# Patient Record
Sex: Male | Born: 1980 | Race: Black or African American | Hispanic: No | Marital: Single | Smoking: Current every day smoker
Health system: Southern US, Community
[De-identification: ages and names within clinical notes are randomized; demographics above are authoritative.]

## PROBLEM LIST (undated history)

## (undated) DIAGNOSIS — F329 Major depressive disorder, single episode, unspecified: Secondary | ICD-10-CM

## (undated) DIAGNOSIS — K5010 Crohn's disease of large intestine without complications: Secondary | ICD-10-CM

## (undated) DIAGNOSIS — F32A Depression, unspecified: Secondary | ICD-10-CM

## (undated) DIAGNOSIS — K5190 Ulcerative colitis, unspecified, without complications: Secondary | ICD-10-CM

## (undated) DIAGNOSIS — I10 Essential (primary) hypertension: Secondary | ICD-10-CM

## (undated) DIAGNOSIS — F200 Paranoid schizophrenia: Secondary | ICD-10-CM

## (undated) DIAGNOSIS — F319 Bipolar disorder, unspecified: Secondary | ICD-10-CM

## (undated) DIAGNOSIS — F22 Delusional disorders: Secondary | ICD-10-CM

## (undated) DIAGNOSIS — F419 Anxiety disorder, unspecified: Secondary | ICD-10-CM

## (undated) HISTORY — PX: OTHER SURGICAL HISTORY: SHX169

## (undated) HISTORY — PX: APPENDECTOMY: SHX54

---
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
## 2018-11-14 ENCOUNTER — Ambulatory Visit: Payer: Self-pay | Admitting: Family Medicine

## 2018-12-13 ENCOUNTER — Ambulatory Visit: Payer: Self-pay | Admitting: Internal Medicine

## 2019-08-11 ENCOUNTER — Emergency Department (HOSPITAL_COMMUNITY): Payer: Self-pay

## 2019-08-11 ENCOUNTER — Emergency Department (HOSPITAL_COMMUNITY)
Admission: EM | Admit: 2019-08-11 | Discharge: 2019-08-11 | Disposition: A | Discharge status: Home / Self Care | Payer: Self-pay | Attending: Emergency Medicine | Admitting: Emergency Medicine

## 2019-08-11 ENCOUNTER — Other Ambulatory Visit: Payer: Self-pay

## 2019-08-11 DIAGNOSIS — M545 Low back pain, unspecified: Secondary | ICD-10-CM

## 2019-08-11 DIAGNOSIS — R109 Unspecified abdominal pain: Secondary | ICD-10-CM | POA: Insufficient documentation

## 2019-08-11 LAB — CBC WITH DIFFERENTIAL/PLATELET
Abs Immature Granulocytes: 0.03 10*3/uL (ref 0.00–0.07)
Basophils Absolute: 0 10*3/uL (ref 0.0–0.1)
Basophils Relative: 0 %
Eosinophils Absolute: 0.1 10*3/uL (ref 0.0–0.5)
Eosinophils Relative: 1 %
HCT: 53.9 % — ABNORMAL HIGH (ref 39.0–52.0)
Hemoglobin: 17.4 g/dL — ABNORMAL HIGH (ref 13.0–17.0)
Immature Granulocytes: 0 %
Lymphocytes Relative: 18 %
Lymphs Abs: 2.1 10*3/uL (ref 0.7–4.0)
MCH: 31.9 pg (ref 26.0–34.0)
MCHC: 32.3 g/dL (ref 30.0–36.0)
MCV: 98.7 fL (ref 80.0–100.0)
Monocytes Absolute: 0.9 10*3/uL (ref 0.1–1.0)
Monocytes Relative: 7 %
Neutro Abs: 8.8 10*3/uL — ABNORMAL HIGH (ref 1.7–7.7)
Neutrophils Relative %: 74 %
Platelets: 230 10*3/uL (ref 150–400)
RBC: 5.46 MIL/uL (ref 4.22–5.81)
RDW: 10.9 % — ABNORMAL LOW (ref 11.5–15.5)
WBC: 11.9 10*3/uL — ABNORMAL HIGH (ref 4.0–10.5)
nRBC: 0 % (ref 0.0–0.2)

## 2019-08-11 LAB — BASIC METABOLIC PANEL
Anion gap: 8 (ref 5–15)
BUN: 7 mg/dL (ref 6–20)
CO2: 25 mmol/L (ref 22–32)
Calcium: 9.2 mg/dL (ref 8.9–10.3)
Chloride: 106 mmol/L (ref 98–111)
Creatinine, Ser: 1.24 mg/dL (ref 0.61–1.24)
GFR calc Af Amer: >60 mL/min (ref >60)
GFR calc non Af Amer: >60 mL/min (ref >60)
Glucose, Bld: 88 mg/dL (ref 70–99)
Potassium: 4.4 mmol/L (ref 3.5–5.1)
Sodium: 139 mmol/L (ref 135–145)

## 2019-08-11 LAB — URINALYSIS, ROUTINE W REFLEX MICROSCOPIC
Bilirubin Urine: NEGATIVE
Glucose, UA: NEGATIVE mg/dL
Ketones, ur: NEGATIVE mg/dL
Leukocytes,Ua: NEGATIVE
Nitrite: NEGATIVE
Protein, ur: NEGATIVE mg/dL
Specific Gravity, Urine: 1.019 (ref 1.005–1.030)
pH: 6 (ref 5.0–8.0)

## 2019-08-11 MED ORDER — KETOROLAC TROMETHAMINE 60 MG/2ML IM SOLN
60.0000 mg | Freq: Once | INTRAMUSCULAR | Status: AC
  Administered 2019-08-11: 60 mg via INTRAMUSCULAR
  Filled 2019-08-11: qty 2

## 2019-08-11 MED ORDER — ACETAMINOPHEN 500 MG PO TABS
1000.0000 mg | ORAL_TABLET | Freq: Once | ORAL | Status: AC
  Administered 2019-08-11: 18:00:00 1000 mg via ORAL
  Filled 2019-08-11: qty 2

## 2019-08-11 MED ORDER — DICLOFENAC SODIUM 1 % TD GEL: 4.0000 g | Freq: Four times a day (QID) | TRANSDERMAL | 0 refills | Status: DC

## 2019-08-11 MED ORDER — PREDNISONE 10 MG (21) PO TBPK: ORAL_TABLET | Freq: Every day | ORAL | 0 refills | Status: DC

## 2019-08-11 MED ORDER — METHOCARBAMOL 500 MG PO TABS
500.0000 mg | ORAL_TABLET | Freq: Once | ORAL | Status: AC
  Administered 2019-08-11: 500 mg via ORAL
  Filled 2019-08-11: qty 1

## 2019-08-11 MED ORDER — METHOCARBAMOL 500 MG PO TABS: 500.0000 mg | ORAL_TABLET | Freq: Three times a day (TID) | ORAL | 0 refills | Status: AC

## 2019-08-11 NOTE — ED Notes (Signed)
Patient left without discharge and prescriptions.

## 2019-08-11 NOTE — Discharge Instructions (Addendum)
You were seen in the ER for low left back pain  Initial x-ray showed possible right kidney stone however CT of kidney clarified and you do not have a stone.   Urine did not look infected.   Imaging showed mild bone slipping in low back bones.  This is called anterolisthesis of S1 on S2. This could be irritating a nerve.  This could also just be muscle spasms and tightness.   We will treat your inflammation with the following medication regimen: Prednisone taper which helps nerve inflammation and pain Robaxin 500 mg every 8 hours x 5 days Ibuprofen 600 mg + Tylenol 1000 mg every 8 hours for the next 5 days Heating pad as needed Over the counter lidocaine patches (salonpas) can be helpful Voltaren gel to use over painful area, massage into muscles. Apply heating pack to the area after gel to help absorb   Avoid any exacerbating activities for the next 48 hours.  After 48 hours, start doing light back range of motion exercises and walking to avoid worsening back stiffness.   Return for fevers, chills, abdominal pain, changes in bowel movement, urinary symptoms, groin numbness, loss of bladder or bowel control, numbness weakness or heaviness to your extremities, rash.

## 2019-08-11 NOTE — ED Notes (Signed)
No answer x1

## 2019-08-11 NOTE — ED Notes (Signed)
Patient in CT

## 2019-08-11 NOTE — ED Notes (Signed)
Pt came out threatening to leave. Writer explained to pt he was waiting for his results to come in and the MD would explain the results to him. Pt waiting in room at this time.

## 2019-08-11 NOTE — ED Triage Notes (Signed)
Pt reports left lower back pains since he was seen at [facility] center several weeks ago. Reports had kidney problem. Pt unable to sit in triage due to pains.

## 2019-08-11 NOTE — ED Provider Notes (Signed)
[facility] DEPT Provider Note   CSN: [id number] Arrival date & time: 08/11/19  1516     History   Chief Complaint Chief Complaint  Patient presents with  . Back Pain    HPI Travis Goodman is a 38 y.o. male with history of Crohn's presents to the ER for evaluation of left lower back pain that began 2 days ago.  Described as constant, sharp.  Nonradiating.  It is worse with laughing, coughing, moving, bending motions of the trunk, putting weight on his left leg.  States yesterday he drove approximately 6 hours and states usually he leans mostly on his left buttock when he is driving.  No interventions.  No alleviating factors.  Yesterday he had mild nausea but no vomiting.  States he has had "urethra" problems for his entire life.  Describes this as having to push to urinate.  He got out of prison 9 months ago.  States he used to be on a pill for his urethra while in prison but once he got out he was not able to get a doctor so he has not been on any medicines.  He is also supposed to be on 2 medicines for Crohn's but has not been taking them.  No falls, previous back injuries or surgeries.  He does a lot of lifting at work.  No fever, chills, vomiting, anterior abdominal pain, dysuria, hematuria.  States he still has to strain to urinate but this is not new.  No history of kidney stones.  No saddle anesthesia, loss of bladder or bowel function, distal numbness or weakness.  Was constipated but started having normal BMs two days ago. No diarrhea or melena.      HPI  No past medical history on file.  There are no active problems to display for this patient.   ** The histories are not reviewed yet. Please review them in the "History" navigator section and refresh this [place].      Home Medications    Prior to Admission medications   Medication Sig Start Date End Date Taking? Authorizing Provider  diclofenac sodium (VOLTAREN) 1 % GEL Apply 4 g topically  4 (four) times daily. 08/11/19   Kinnie Feil, PA-C  methocarbamol (ROBAXIN) 500 MG tablet Take 1 tablet (500 mg total) by mouth 3 (three) times daily for 7 days. 08/11/19 08/18/19  Kinnie Feil, PA-C  predniSONE (STERAPRED UNI-PAK 21 TAB) 10 MG (21) TBPK tablet Take by mouth daily. Take 6 tabs by mouth daily  for 2 days, then 5 tabs for 2 days, then 4 tabs for 2 days, then 3 tabs for 2 days, 2 tabs for 2 days, then 1 tab by mouth daily for 2 days 08/11/19   Kinnie Feil, PA-C    Family History No family history on file.  Social History Social History   Tobacco Use  . Smoking status: Not on file  Substance Use Topics  . Alcohol use: Not on file  . Drug use: Not on file     Allergies   Patient has no known allergies.   Review of Systems Review of Systems  Genitourinary: Positive for difficulty urinating (chronic) and flank pain.  Musculoskeletal: Positive for back pain and myalgias.  All other systems reviewed and are negative.    Physical Exam Updated Vital Signs BP (!) 148/87 (BP Location: Right Arm)   Pulse 70   Temp 98.3 F (36.8 C) (Oral)   Resp 20   SpO2  100%   Physical Exam Vitals signs and nursing note reviewed.  Constitutional:      Appearance: He is well-developed.     Comments: Non toxic.  Looks slightly uncomfortable, sitting with weight on right buttock/side   HENT:     Head: Normocephalic and atraumatic.     Nose: Nose normal.  Eyes:     Conjunctiva/sclera: Conjunctivae normal.  Neck:     Musculoskeletal: Normal range of motion.  Cardiovascular:     Rate and Rhythm: Normal rate and regular rhythm.     Heart sounds: Normal heart sounds.     Comments: 1+ radial and DP pulses bilaterally  Pulmonary:     Effort: Pulmonary effort is normal.     Breath sounds: Normal breath sounds.  Abdominal:     General: Bowel sounds are normal.     Palpations: Abdomen is soft.     Tenderness: There is no abdominal tenderness. There is left CVA  tenderness.     Comments: Left low lateral CVA tenderness. No reproducible muscular tenderness with palpation on thoracic/lumbar muscles.  No G/R/R. No suprapubic tenderness. Negative Murphy's and McBurney's  Musculoskeletal: Normal range of motion.        General: Tenderness present.     Comments: TL spine: no midline or paraspinal muscle tenderness. No reproducible tenderness with palpation to muscles of back.  Mild left SI joint and sciatic notch tenderness. + SLR (pain in left low back and buttock).  Ambulates to bathroom with antalgic gait favoring left.  Pelvis: no bony tenderness to inguinal creases, greater trochanters bilaterally. IR/ER of hips bilaterally without pain.  Skin:    General: Skin is warm and dry.     Capillary Refill: Capillary refill takes less than 2 seconds.  Neurological:     Mental Status: He is alert.     Comments: Sensation and strength intact in LEs  Psychiatric:        Behavior: Behavior normal.      ED Treatments / Results  Labs (all labs ordered are listed, but only abnormal results are displayed) Labs Reviewed  URINALYSIS, ROUTINE W REFLEX MICROSCOPIC - Abnormal; Notable for the following components:      Result Value   APPearance HAZY (*)    Hgb urine dipstick SMALL (*)    Bacteria, UA RARE (*)    All other components within normal limits  CBC WITH DIFFERENTIAL/PLATELET - Abnormal; Notable for the following components:   WBC 11.9 (*)    Hemoglobin 17.4 (*)    HCT 53.9 (*)    RDW 10.9 (*)    Neutro Abs 8.8 (*)    All other components within normal limits  BASIC METABOLIC PANEL    EKG None  Radiology Dg Lumbar Spine 2-3 Views  Result Date: 08/11/2019 CLINICAL DATA:  Acute low back pain for several weeks. Initial encounter. EXAM: LUMBAR SPINE - 2-3 VIEW COMPARISON:  None. FINDINGS: Five non rib-bearing lumbar type vertebra are identified with a lumbarized S1 segment. Apparent mild grade 1 anterolisthesis of S1 on S2 noted. Degenerative disc  disease at T12-L1 noted. No acute fracture noted.  No focal bony lesions are present. IMPRESSION: Lumbarized S1 segment with apparent grade 1 anterolisthesis of S1 on S2. No acute fracture. Electronically Signed   By: Margarette Canada M.D.   On: 08/11/2019 18:30   Dg Si Joints  Result Date: 08/11/2019 CLINICAL DATA:  Bilateral SI joint pain.  Initial encounter. EXAM: BILATERAL SACROILIAC JOINTS - 3+ VIEW COMPARISON:  None.  FINDINGS: The sacroiliac joint spaces are maintained and there is no evidence of arthropathy. No other bone abnormalities are seen. IMPRESSION: Negative. Electronically Signed   By: Margarette Canada M.D.   On: 08/11/2019 18:30   Dg Abdomen Acute W/chest  Result Date: 08/11/2019 CLINICAL DATA:  Acute abdominal pain for several weeks. EXAM: DG ABDOMEN ACUTE W/ 1V CHEST COMPARISON:  None. FINDINGS: A 2 mm calcification within the RIGHT anatomic pelvis is nonspecific-correlate with possibility of distal ureteral calculus. The bowel gas pattern is unremarkable. Cardiomediastinal silhouette is unremarkable. The lungs are clear. No acute bony abnormalities are noted. IMPRESSION: 2 mm calcification within the RIGHT anatomic pelvis-nonspecific. Consider CT if there is clinical suspicion for distal RIGHT ureteral calculus. Electronically Signed   By: Margarette Canada M.D.   On: 08/11/2019 18:34   Ct Renal Stone Study  Result Date: 08/11/2019 CLINICAL DATA:  38 year old male with acute LEFT flank and abdominal pain. EXAM: CT ABDOMEN AND PELVIS WITHOUT CONTRAST TECHNIQUE: Multidetector CT imaging of the abdomen and pelvis was performed following the standard protocol without IV contrast. COMPARISON:  Radiographs performed today FINDINGS: Please note that parenchymal abnormalities may be missed without intravenous contrast. Lower chest: No acute abnormality. Hepatobiliary: The liver and gallbladder are unremarkable. No biliary dilatation. Pancreas: Unremarkable Spleen: Unremarkable Adrenals/Urinary Tract: The  kidneys, adrenal glands and bladder are unremarkable. No biliary dilatation. Stomach/Bowel: Stomach is within normal limits. No evidence of bowel wall thickening, distention, or inflammatory changes. Vascular/Lymphatic: Aortic atherosclerosis. No enlarged abdominal or pelvic lymph nodes. Reproductive: Prostate is unremarkable. Other: No ascites, pneumoperitoneum or focal collection. Musculoskeletal: No acute or suspicious bony abnormality. IMPRESSION: 1. No evidence of acute abnormality. No hydronephrosis or urinary calculi. 2.  Aortic Atherosclerosis (ICD10-I70.0). Electronically Signed   By: Margarette Canada M.D.   On: 08/11/2019 19:18    Procedures Procedures (including critical care time)  Medications Ordered in ED Medications  ketorolac (TORADOL) injection 60 mg (60 mg Intramuscular Given 08/11/19 1744)  acetaminophen (TYLENOL) tablet 1,000 mg (1,000 mg Oral Given 08/11/19 1744)  methocarbamol (ROBAXIN) tablet 500 mg (500 mg Oral Given 08/11/19 1744)     Initial Impression / Assessment and Plan / ED Course  I have reviewed the triage vital signs and the nursing notes.  Pertinent labs & imaging results that were available during my care of the patient were reviewed by me and considered in my medical decision making (see chart for details).  Clinical Course as of Aug 10 2156  Mon Aug 11, 2019  1825 RBC / HPF: 0-5 [CG]  1825 Bacteria, UA(!): RARE [CG]  1825 Ca Oxalate Crys, UA: PRESENT [CG]  1838 IMPRESSION: Lumbarized S1 segment with apparent grade 1 anterolisthesis of S1 on S2.  No acute fracture.  DG Lumbar Spine 2-3 Views [CG]  1838 2 mm calcification within the RIGHT anatomic pelvis-nonspecific. Consider CT if there is clinical suspicion for distal RIGHT ureteral calculus.  DG Abdomen Acute W/Chest [CG]  1932 IMPRESSION: 1. No evidence of acute abnormality. No hydronephrosis or urinary calculi. 2. Aortic Atherosclerosis (ICD10-I70.0).  CT Renal Stone Study [CG]  1942 WBC(!): 11.9  [CG]    Clinical Course User Index [CG] Kinnie Feil, PA-C   38 year old here with atraumatic left low back pain.  Drove 6 hours in his car yesterday and states he usually sits mostly on his left buttock while driving.  Reports chronic "urethra" problems specifically having to strain to urinate but these have not changed recently.  No urinary symptoms but had nausea  2 days ago.  No fever, cauda equina symptoms, changes in his bowel movements.  No history of kidney stones.  Exam reveals left CVA tenderness and reproducible left SI and sciatic notch tenderness with positive SLR.  Based on exam and history DDX includes both MSK etiology but also renal etiology such as pyelonephritis, kidney stone although the latter is considered less likely because he has no fever, vomiting, dysuria, hematuria or history of stones in the past.  He also suggest preceding event that could have irritated the left sciatic nerve/muscles in the left low back/buttock.  We will start with urinalysis and x-rays of the lumbar spine, SI joints and abdomen to evaluate for acute bony abnormalities as well as obvious renal stones on KUB.  Toradol, Tylenol and Robaxin given.  We will plan to reassess.  Pending UA and x-rays  1830: UA shows rare bacteria, mucus and calcium oxalate crystals.  No convincing signs of infection.  No RBCs. Given ca oxalate and flank pain with nausea concern for stone. KUB some RIGHT ureteral stone but none on the symptomatic side. Lumbar x-rays shows anterolisthesis of S1 on S2 which could be causing left sided pain/radiculopathy.  Reassessed patient and reports only mild improvement in pain.  Discussed plan to f/u with cbc, bmp and CT renal to r/o small renal stone, agreeable.   2255: CT renal unremarkable. Creatinine normal.  Will dc with symptomatic tx of MSK low back pain vs radicular/sciatic nerve symptoms vs piriformis muscle syndrome.    Unfortunately pt eloped without AVS or prescriptions.    Final Clinical Impressions(s) / ED Diagnoses   Final diagnoses:  Lower back pain    ED Discharge Orders         Ordered    predniSONE (STERAPRED UNI-PAK 21 TAB) 10 MG (21) TBPK tablet  Daily     08/11/19 1954    methocarbamol (ROBAXIN) 500 MG tablet  3 times daily     08/11/19 1954    diclofenac sodium (VOLTAREN) 1 % GEL  4 times daily     08/11/19 1954           Arlean Hopping 08/11/19 2157    Lacretia Leigh, MD 08/12/19 [phone]

## 2019-08-11 NOTE — ED Notes (Signed)
Refuse vitals

## 2019-12-22 ENCOUNTER — Other Ambulatory Visit: Payer: Self-pay

## 2019-12-22 ENCOUNTER — Encounter (HOSPITAL_COMMUNITY): Payer: Self-pay

## 2019-12-22 ENCOUNTER — Emergency Department (HOSPITAL_COMMUNITY)
Admission: EM | Admit: 2019-12-22 | Discharge: 2019-12-22 | Disposition: A | Discharge status: Home / Self Care | Payer: Medicaid Other | Attending: Emergency Medicine | Admitting: Emergency Medicine

## 2019-12-22 DIAGNOSIS — Y999 Unspecified external cause status: Secondary | ICD-10-CM | POA: Insufficient documentation

## 2019-12-22 DIAGNOSIS — Y939 Activity, unspecified: Secondary | ICD-10-CM | POA: Insufficient documentation

## 2019-12-22 DIAGNOSIS — M5441 Lumbago with sciatica, right side: Secondary | ICD-10-CM | POA: Insufficient documentation

## 2019-12-22 DIAGNOSIS — Z79899 Other long term (current) drug therapy: Secondary | ICD-10-CM | POA: Insufficient documentation

## 2019-12-22 DIAGNOSIS — S71101A Unspecified open wound, right thigh, initial encounter: Secondary | ICD-10-CM | POA: Insufficient documentation

## 2019-12-22 DIAGNOSIS — G8929 Other chronic pain: Secondary | ICD-10-CM

## 2019-12-22 DIAGNOSIS — F17210 Nicotine dependence, cigarettes, uncomplicated: Secondary | ICD-10-CM | POA: Insufficient documentation

## 2019-12-22 DIAGNOSIS — Y929 Unspecified place or not applicable: Secondary | ICD-10-CM | POA: Insufficient documentation

## 2019-12-22 DIAGNOSIS — X58XXXA Exposure to other specified factors, initial encounter: Secondary | ICD-10-CM | POA: Insufficient documentation

## 2019-12-22 DIAGNOSIS — H538 Other visual disturbances: Secondary | ICD-10-CM | POA: Insufficient documentation

## 2019-12-22 DIAGNOSIS — H547 Unspecified visual loss: Secondary | ICD-10-CM

## 2019-12-22 DIAGNOSIS — F319 Bipolar disorder, unspecified: Secondary | ICD-10-CM | POA: Insufficient documentation

## 2019-12-22 HISTORY — DX: Depression, unspecified: F32.A

## 2019-12-22 HISTORY — DX: Ulcerative colitis, unspecified, without complications: K51.90

## 2019-12-22 HISTORY — DX: Anxiety disorder, unspecified: F41.9

## 2019-12-22 HISTORY — DX: Delusional disorders: F22

## 2019-12-22 HISTORY — DX: Bipolar disorder, unspecified: F31.9

## 2019-12-22 HISTORY — DX: Crohn's disease of large intestine without complications: K50.10

## 2019-12-22 HISTORY — DX: Paranoid schizophrenia: F20.0

## 2019-12-22 MED ORDER — LIDOCAINE 5 % EX PTCH: 1.0000 | MEDICATED_PATCH | CUTANEOUS | 0 refills | Status: DC

## 2019-12-22 MED ORDER — METHOCARBAMOL 500 MG PO TABS: 500.0000 mg | ORAL_TABLET | Freq: Two times a day (BID) | ORAL | 0 refills | Status: DC

## 2019-12-22 NOTE — Discharge Instructions (Addendum)
  Recommend follow-up with the ophthalmologist (eye doctor) to evaluate your vision changes.  Call to make an appointment.  Take it easy, but do not lay around too much as this may make any stiffness worse.  Antiinflammatory medications: Take 600 mg of ibuprofen every 6 hours or 440 mg (over the counter dose) to 500 mg (prescription dose) of naproxen every 12 hours for the next 3 days. After this time, these medications may be used as needed for pain. Take these medications with food to avoid upset stomach. Choose only one of these medications, do not take them together. Acetaminophen (generic for Tylenol): Should you continue to have additional pain while taking the ibuprofen or naproxen, you may add in acetaminophen as needed. Your daily total maximum amount of acetaminophen from all sources should be limited to 4065m/day for persons without liver problems, or 20088mday for those with liver problems. Methocarbamol: Methocarbamol (generic for Robaxin) is a muscle relaxer and can help relieve stiff muscles or muscle spasms.  Do not drive or perform other dangerous activities while taking this medication as it can cause drowsiness as well as changes in reaction time and judgement. Lidocaine patches: These are available via either prescription or over-the-counter. The over-the-counter option may be more economical one and are likely just as effective. There are multiple over-the-counter brands, such as Salonpas. Ice: May apply ice to the area over the next 24 hours for 15 minutes at a time to reduce pain, inflammation, and swelling, if present. Exercises: Be sure to perform the attached exercises starting with three times a week and working up to performing them daily. This is an essential part of preventing long term problems.  Follow up: Follow up with a primary care provider for any future management of these complaints. Be sure to follow up within 7-10 days. Return: Return to the ED should symptoms  worsen.  For prescription assistance, may try using prescription discount sites or apps, such as goodrx.com

## 2019-12-22 NOTE — ED Provider Notes (Signed)
[facility] DEPT Provider Note   CSN: [id number] Arrival date & time: 12/22/19  1752     History Chief Complaint  Patient presents with  . leg wound  . Back Pain  . eye issue    Travis Goodman is a 39 y.o. male.  HPI      Travis Goodman is a 39 y.o. male, with a history of HTN, bipolar, anxiety, depression, paranoid schizophrenia, presenting to the ED with several complaints.  Patient complains of intermittent right-sided back spasms for the last 5 months.  He states he has times of them and they last no more than a few seconds at a time.  Moderate to severe pain, radiating down the back of the right leg.  Accompanied by tingling in the right leg.  Denies saddle anesthesias, fever, trauma, persistent weakness, persistent numbness, inability to urinate or incontinence.  Denies IV drug use, or HIV.  Wound on the right medial upper leg he states has been present for the last month.  He states it looks much better than it did previously.  Denies any pain with the wound, drainage, swelling.  For the last several months he will have intermittent episodes with blurriness in one eye or the other, sometimes both eyes.  These episodes last for a few seconds at a time with no persistent symptoms.  No persistent headaches, vision loss, eye trauma, eye drainage, eye pain, or any other complaints.  He states he recently got out of prison and does not have a primary care provider to whom he can bring these issues. He is followed by [facility] for his mental health and states he has adequate supply of his medications.  He has not been experiencing any acute psychiatric difficulties.    Past Medical History:  Diagnosis Date  . Anxiety   . Bipolar 1 disorder ([place])   . Crohn's colitis ([place])   . Depression   . Paranoia ([place])   . Schizophrenia, paranoid type ([place])   . Ulcerative colitis ([place])     There are no problems to display for this patient.   Past Surgical  History:  Procedure Laterality Date  . APPENDECTOMY    . arm fracture surgery         Family History  Problem Relation Age of Onset  . Heart failure Mother   . Diabetes Mother   . Hypertension Mother     Social History   Tobacco Use  . Smoking status: Current Every Day Smoker    Packs/day: 1.00    Types: Cigarettes  . Smokeless tobacco: Never Used  Substance Use Topics  . Alcohol use: Never  . Drug use: Yes    Types: Marijuana    Home Medications Prior to Admission medications   Medication Sig Start Date End Date Taking? Authorizing Provider  diclofenac sodium (VOLTAREN) 1 % GEL Apply 4 g topically 4 (four) times daily. 08/11/19   Kinnie Feil, PA-C  lidocaine (LIDODERM) 5 % Place 1 patch onto the skin daily. Remove & Discard patch within 12 hours or as directed by MD 12/22/19   Lashica Hannay C, PA-C  methocarbamol (ROBAXIN) 500 MG tablet Take 1 tablet (500 mg total) by mouth 2 (two) times daily. 12/22/19   Patty Leitzke C, PA-C  predniSONE (STERAPRED UNI-PAK 21 TAB) 10 MG (21) TBPK tablet Take by mouth daily. Take 6 tabs by mouth daily  for 2 days, then 5 tabs for 2 days, then 4 tabs for 2 days, then  3 tabs for 2 days, 2 tabs for 2 days, then 1 tab by mouth daily for 2 days 08/11/19   Kinnie Feil, PA-C    Allergies    Patient has no known allergies.  Review of Systems   Review of Systems  Constitutional: Negative for chills, diaphoresis and fever.  HENT: Negative for facial swelling.   Eyes: Positive for visual disturbance.  Respiratory: Negative for cough and shortness of breath.   Cardiovascular: Negative for chest pain and leg swelling.  Gastrointestinal: Negative for abdominal pain, diarrhea, nausea and vomiting.  Musculoskeletal: Positive for back pain.  Skin: Positive for wound.  Neurological: Negative for dizziness, syncope, weakness and numbness.  All other systems reviewed and are negative.   Physical Exam Updated Vital Signs BP (!) 162/104 (BP  Location: Left Arm) Comment: Patient states " I have high BP but do not take prescribed meds."  Pulse 89   Temp 98.6 F (37 C) (Oral)   Resp 20   Ht 5' 9"  (1.753 m)   Wt 74.8 kg   SpO2 98%   BMI 24.37 kg/m   Physical Exam Vitals and nursing note reviewed.  Constitutional:      General: He is not in acute distress.    Appearance: He is well-developed. He is not diaphoretic.  HENT:     Head: Normocephalic and atraumatic.     Mouth/Throat:     Mouth: Mucous membranes are moist.     Pharynx: Oropharynx is clear.  Eyes:     Extraocular Movements: Extraocular movements intact.     Conjunctiva/sclera: Conjunctivae normal.     Pupils: Pupils are equal, round, and reactive to light.     Comments: Patient does not seem to have gross abnormalities in his close-up or distance vision.  He is able to see things around the room.  He is able to identify features on my scribes, read, identify objects. No pain with EOMs.  No facial swelling, erythema, or wounds. No eye ptosis.  EOMs appear to in sync bilaterally.  Cardiovascular:     Rate and Rhythm: Normal rate and regular rhythm.     Pulses: Normal pulses.          Radial pulses are 2+ on the right side and 2+ on the left side.       Posterior tibial pulses are 2+ on the right side and 2+ on the left side.     Heart sounds: Normal heart sounds.     Comments: Tactile temperature in the extremities appropriate and equal bilaterally. Pulmonary:     Effort: Pulmonary effort is normal. No respiratory distress.     Breath sounds: Normal breath sounds.  Abdominal:     Palpations: Abdomen is soft.     Tenderness: There is no abdominal tenderness. There is no guarding.  Musculoskeletal:     Cervical back: Neck supple.     Right lower leg: No edema.     Left lower leg: No edema.     Comments: No tenderness throughout the back musculature. Normal motor function intact in all extremities. No midline spinal tenderness.   Scabbed over approximately  1.5 cm diameter wound to the right medial upper leg, just superior to the knee.  No tenderness, swelling, fluctuance, increased warmth, surrounding erythema. No pain or tenderness throughout the upper or lower leg musculature. No pain, swelling, deformity, or instability to the knee.  No pain with range of motion of the right knee.  Lymphadenopathy:  Cervical: No cervical adenopathy.  Skin:    General: Skin is warm and dry.  Neurological:     Mental Status: He is alert and oriented to person, place, and time.     Comments: No noted acute cognitive deficit. Sensation grossly intact to light touch in the extremities.   Grip strengths equal bilaterally.   Strength 5/5 in all extremities.  No gait disturbance.  Coordination intact.  Cranial nerves III-XII grossly intact.  Handles oral secretions without noted difficulty.  No noted phonation or speech deficit. No facial droop.   Psychiatric:        Mood and Affect: Mood and affect normal.        Speech: Speech normal.        Behavior: Behavior normal.                ED Results / Procedures / Treatments   Labs (all labs ordered are listed, but only abnormal results are displayed) Labs Reviewed - No data to display  EKG None  Radiology No results found.  Procedures Procedures (including critical care time)  Medications Ordered in ED Medications - No data to display  ED Course  I have reviewed the triage vital signs and the nursing notes.  Pertinent labs & imaging results that were available during my care of the patient were reviewed by me and considered in my medical decision making (see chart for details).    MDM Rules/Calculators/A&P                      Patient presents with a multitude of complaints. I do not see any emergent medical conditions at patient presentation today. His vision abnormality is intermittent and fleeting.  He will follow-up with ophthalmology on this matter. Advised him to  follow-up with a PCP versus orthopedics on his back pain. His scabbed over wound on his leg appears to be healing well.  He showed me photos of what it initially looked like and there has been significant improvement, which is quite reassuring. I gave him resources for PCP follow-up. The patient was given instructions for home care as well as return precautions. Patient voices understanding of these instructions, accepts the plan, and is comfortable with discharge.  Vitals:   12/22/19 1805 12/22/19 1816 12/22/19 2114  BP: (!) 162/104  (!) 144/71  Pulse: 89  88  Resp: 20  18  Temp: 98.6 F (37 C)    TempSrc: Oral    SpO2: 98%  99%  Weight:  74.8 kg   Height:  5' 9"  (1.753 m)    Patient noted to be hypertensive today.  He does not appear to be symptomatic to this.  He states he has a history of hypertension, but no recent history of medication management for it.  Advised PCP follow-up for further management of this issue.  Final Clinical Impression(s) / ED Diagnoses Final diagnoses:  Chronic right-sided low back pain with right-sided sciatica  Alteration in vision    Rx / DC Orders ED Discharge Orders         Ordered    methocarbamol (ROBAXIN) 500 MG tablet  2 times daily     12/22/19 2047    lidocaine (LIDODERM) 5 %  Every 24 hours     12/22/19 2047           Lorayne Bender, PA-C 12/23/19 [place], Dan, DO 12/23/19 1936

## 2019-12-22 NOTE — ED Triage Notes (Signed)
Patient reports that he is having right lower back pain x 5 months  Patient states he is having vision problems out of his right eye. Patient states, "I have a circle in my vision and I can not see out of the circle."  Patient states he has a leg wound on the inner right knee x 1 month or longer.

## 2019-12-22 NOTE — ED Notes (Signed)
An After Visit Summary was printed and given to the patient. Discharge instructions given and no further questions at this time.  

## 2020-02-08 ENCOUNTER — Emergency Department
Admission: EM | Admit: 2020-02-08 | Discharge: 2020-02-08 | Disposition: A | Discharge status: Home / Self Care | Payer: Medicaid Other | Attending: Emergency Medicine | Admitting: Emergency Medicine

## 2020-02-08 ENCOUNTER — Other Ambulatory Visit: Payer: Self-pay

## 2020-02-08 ENCOUNTER — Encounter: Payer: Self-pay | Admitting: Emergency Medicine

## 2020-02-08 DIAGNOSIS — Z79899 Other long term (current) drug therapy: Secondary | ICD-10-CM | POA: Insufficient documentation

## 2020-02-08 DIAGNOSIS — L989 Disorder of the skin and subcutaneous tissue, unspecified: Secondary | ICD-10-CM | POA: Insufficient documentation

## 2020-02-08 DIAGNOSIS — F17210 Nicotine dependence, cigarettes, uncomplicated: Secondary | ICD-10-CM | POA: Insufficient documentation

## 2020-02-08 DIAGNOSIS — Z113 Encounter for screening for infections with a predominantly sexual mode of transmission: Secondary | ICD-10-CM | POA: Insufficient documentation

## 2020-02-08 DIAGNOSIS — Z711 Person with feared health complaint in whom no diagnosis is made: Secondary | ICD-10-CM

## 2020-02-08 LAB — CHLAMYDIA/NGC RT PCR (ARMC ONLY)
Chlamydia Tr: NOT DETECTED
N gonorrhoeae: NOT DETECTED

## 2020-02-08 LAB — COMPREHENSIVE METABOLIC PANEL
ALT: 20 U/L (ref 0–44)
AST: 21 U/L (ref 15–41)
Albumin: 4.4 g/dL (ref 3.5–5.0)
Alkaline Phosphatase: 107 U/L (ref 38–126)
Anion gap: 7 (ref 5–15)
BUN: 10 mg/dL (ref 6–20)
CO2: 32 mmol/L (ref 22–32)
Calcium: 9.5 mg/dL (ref 8.9–10.3)
Chloride: 101 mmol/L (ref 98–111)
Creatinine, Ser: 1.22 mg/dL (ref 0.61–1.24)
GFR calc Af Amer: >60 mL/min (ref >60)
GFR calc non Af Amer: >60 mL/min (ref >60)
Glucose, Bld: 87 mg/dL (ref 70–99)
Potassium: 4.1 mmol/L (ref 3.5–5.1)
Sodium: 140 mmol/L (ref 135–145)
Total Bilirubin: 0.8 mg/dL (ref 0.3–1.2)
Total Protein: 7.5 g/dL (ref 6.5–8.1)

## 2020-02-08 LAB — CBC WITH DIFFERENTIAL/PLATELET
Abs Immature Granulocytes: 0.02 10*3/uL (ref 0.00–0.07)
Basophils Absolute: 0 10*3/uL (ref 0.0–0.1)
Basophils Relative: 1 %
Eosinophils Absolute: 0.1 10*3/uL (ref 0.0–0.5)
Eosinophils Relative: 1 %
HCT: 50.5 % (ref 39.0–52.0)
Hemoglobin: 16.9 g/dL (ref 13.0–17.0)
Immature Granulocytes: 0 %
Lymphocytes Relative: 24 %
Lymphs Abs: 1.9 10*3/uL (ref 0.7–4.0)
MCH: 32.3 pg (ref 26.0–34.0)
MCHC: 33.5 g/dL (ref 30.0–36.0)
MCV: 96.4 fL (ref 80.0–100.0)
Monocytes Absolute: 0.5 10*3/uL (ref 0.1–1.0)
Monocytes Relative: 7 %
Neutro Abs: 5.3 10*3/uL (ref 1.7–7.7)
Neutrophils Relative %: 67 %
Platelets: 227 10*3/uL (ref 150–400)
RBC: 5.24 MIL/uL (ref 4.22–5.81)
RDW: 10.6 % — ABNORMAL LOW (ref 11.5–15.5)
WBC: 7.8 10*3/uL (ref 4.0–10.5)
nRBC: 0 % (ref 0.0–0.2)

## 2020-02-08 LAB — URINALYSIS, COMPLETE (UACMP) WITH MICROSCOPIC
Bacteria, UA: NONE SEEN
Bilirubin Urine: NEGATIVE
Glucose, UA: NEGATIVE mg/dL
Ketones, ur: NEGATIVE mg/dL
Leukocytes,Ua: NEGATIVE
Nitrite: NEGATIVE
Protein, ur: NEGATIVE mg/dL
Specific Gravity, Urine: 1.017 (ref 1.005–1.030)
pH: 6 (ref 5.0–8.0)

## 2020-02-08 NOTE — ED Provider Notes (Signed)
[facility] Emergency Department Provider Note   ____________________________________________   First MD Initiated Contact with Patient 02/08/20 1308     (approximate)  I have reviewed the triage vital signs and the nursing notes.   HISTORY  Chief Complaint No chief complaint on file.   HPI Travis Goodman is a 39 y.o. male presents to the ED with complaint of right knee pain.  He states there is an area on his knee that has been there for 3 months.  He states that it has become tender to touch.  He states initially there was "a scab" there and after falling off is become tender.  He is unable to explain why there was a scab present in the beginning and denies any known injury.  He also has a similar area to his left anterior shoulder.  Patient is also here to be checked for an STD.  He states that "someone" told him that he should be checked for an STD.       Past Medical History:  Diagnosis Date  . Anxiety   . Bipolar 1 disorder ([place])   . Crohn's colitis ([place])   . Depression   . Paranoia ([place])   . Schizophrenia, paranoid type ([place])   . Ulcerative colitis ([place])     There are no problems to display for this patient.   Past Surgical History:  Procedure Laterality Date  . APPENDECTOMY    . arm fracture surgery      Prior to Admission medications   Medication Sig Start Date End Date Taking? Authorizing Provider  diclofenac sodium (VOLTAREN) 1 % GEL Apply 4 g topically 4 (four) times daily. 08/11/19   Kinnie Feil, PA-C    Allergies Patient has no known allergies.  Family History  Problem Relation Age of Onset  . Heart failure Mother   . Diabetes Mother   . Hypertension Mother     Social History Social History   Tobacco Use  . Smoking status: Current Every Day Smoker    Packs/day: 1.00    Types: Cigarettes  . Smokeless tobacco: Never Used  Substance Use Topics  . Alcohol use: Never  . Drug use: Yes    Types: Marijuana     Review of Systems Constitutional: No fever/chills Cardiovascular: Denies chest pain. Respiratory: Denies shortness of breath. Gastrointestinal: No abdominal pain.  No nausea, no vomiting.  Genitourinary: Negative for dysuria.  Denies frequency or penile discharge. Musculoskeletal: Negative for back pain. Skin: Positive for questionable lesion versus scar tissue. Neurological: Negative for headaches, focal weakness or numbness. Psychiatric:  Positive for paranoid schizophrenia, bipolar disorder, anxiety. ____________________________________________   PHYSICAL EXAM:  VITAL SIGNS: ED Triage Vitals  Enc Vitals Group     BP 02/08/20 1304 (!) 154/98     Pulse Rate 02/08/20 1304 90     Resp 02/08/20 1304 18     Temp 02/08/20 1304 98.4 F (36.9 C)     Temp Source 02/08/20 1304 Oral     SpO2 02/08/20 1304 96 %     Weight 02/08/20 1307 180 lb (81.6 kg)     Height 02/08/20 1307 5' 9"  (1.753 m)     Head Circumference --      Peak Flow --      Pain Score 02/08/20 1307 0     Pain Loc --      Pain Edu? --      Excl. in [place]? --     Constitutional: Alert and  oriented. Well appearing and in no acute distress. Head: Atraumatic. Neck: No stridor.   Cardiovascular: Normal rate, regular rhythm. Grossly normal heart sounds.  Good peripheral circulation. Respiratory: Normal respiratory effort.  No retractions. Lungs CTAB. Gastrointestinal: Soft and nontender. No distention. Genitourinary: No complaints per patient.  Denies penile discharge. Musculoskeletal: Moves upper and lower extremities that any difficulty.  Normal gait was noted. Neurologic:  Normal speech and language. No gross focal neurologic deficits are appreciated. No gait instability. Skin:  Skin is warm, dry and intact. No rash noted. Psychiatric: Mood and affect are normal. Speech and behavior are normal.  ____________________________________________   LABS (all labs ordered are listed, but only abnormal results are  displayed)  Labs Reviewed  URINALYSIS, COMPLETE (UACMP) WITH MICROSCOPIC - Abnormal; Notable for the following components:      Result Value   Color, Urine YELLOW (*)    APPearance CLEAR (*)    Hgb urine dipstick SMALL (*)    All other components within normal limits  CBC WITH DIFFERENTIAL/PLATELET - Abnormal; Notable for the following components:   RDW 10.6 (*)    All other components within normal limits  CHLAMYDIA/NGC RT PCR ([facility] ONLY)  COMPREHENSIVE METABOLIC PANEL     PROCEDURES  Procedure(s) performed (including Critical Care):  Procedures  ____________________________________________   INITIAL IMPRESSION / ASSESSMENT AND PLAN / ED COURSE  As part of my medical decision making, I reviewed the following data within the electronic MEDICAL RECORD NUMBER Notes from prior ED visits and [place] Controlled Substance Database  39 year old male presents to the ED with concerns about lesions on 2 parts of his body that have been there for more than 3 months.  He states that initially started off with a scab and then fell off leaving areas that are tender.  He denies any injury in these areas.  He states areas are tender to touch and there is been no drainage.  Patient also had concerns for a STD but does not have any symptoms.  GC and Chlamydia test were done and patient is aware that he will be called when these have been resulted.  Lab work was unremarkable and patient was told that he should follow-up with a dermatologist to have these 2 areas looked at and possibly biopsied.  Patient lives in [place] but dermatology listings were in [place].  He will look to see in the phone book about dermatology in [place].  ____________________________________________   FINAL CLINICAL IMPRESSION(S) / ED DIAGNOSES  Final diagnoses:  Skin lesion  Concern about sexually transmitted disease in male without diagnosis     ED Discharge Orders    None       Note:  This document  was prepared using Dragon voice recognition software and may include unintentional dictation errors.    Johnn Hai, PA-C 02/08/20 1455    Vanessa Munday, MD 02/09/20 [id number]

## 2020-02-08 NOTE — Discharge Instructions (Signed)
Call and make an appointment with one of the dermatologist listed on your discharge papers or any dermatologist listed in [place].  This area of concern will need to be biopsied to find out what is causing this area.  Also 1 test that was done in the ED will not have results until tomorrow.  If these results are significant you will be contacted.

## 2020-02-08 NOTE — ED Triage Notes (Signed)
Pt to ED with c/o of right knee pain r/t to what he believes is a spider bite. Pt also would like to be checked for STD. Pt states he was told by "someone" that he might have an STD.

## 2020-02-08 NOTE — ED Notes (Signed)
Pt verbalized understanding of discharge instructions. NAD at this time. 

## 2020-02-08 NOTE — ED Notes (Signed)
First Nurse Note: Pt to ED via POV c/o UTI symptoms and wanting to be checked for STD. Pt is in NAD.

## 2020-04-12 ENCOUNTER — Telehealth (HOSPITAL_COMMUNITY): Payer: Medicaid Other | Admitting: Psychiatry

## 2020-06-07 ENCOUNTER — Other Ambulatory Visit: Payer: Self-pay

## 2020-06-07 ENCOUNTER — Encounter (HOSPITAL_COMMUNITY): Payer: Self-pay | Admitting: Emergency Medicine

## 2020-06-07 ENCOUNTER — Emergency Department (HOSPITAL_COMMUNITY)
Admission: EM | Admit: 2020-06-07 | Discharge: 2020-06-07 | Disposition: A | Discharge status: Home / Self Care | Payer: Medicaid Other | Attending: Emergency Medicine | Admitting: Emergency Medicine

## 2020-06-07 DIAGNOSIS — K5090 Crohn's disease, unspecified, without complications: Secondary | ICD-10-CM | POA: Insufficient documentation

## 2020-06-07 DIAGNOSIS — Z5321 Procedure and treatment not carried out due to patient leaving prior to being seen by health care provider: Secondary | ICD-10-CM | POA: Insufficient documentation

## 2020-06-07 LAB — COMPREHENSIVE METABOLIC PANEL
ALT: 16 U/L (ref 0–44)
AST: 20 U/L (ref 15–41)
Albumin: 3.9 g/dL (ref 3.5–5.0)
Alkaline Phosphatase: 101 U/L (ref 38–126)
Anion gap: 9 (ref 5–15)
BUN: 8 mg/dL (ref 6–20)
CO2: 27 mmol/L (ref 22–32)
Calcium: 9.3 mg/dL (ref 8.9–10.3)
Chloride: 103 mmol/L (ref 98–111)
Creatinine, Ser: 1.15 mg/dL (ref 0.61–1.24)
GFR calc Af Amer: >60 mL/min (ref >60)
GFR calc non Af Amer: >60 mL/min (ref >60)
Glucose, Bld: 83 mg/dL (ref 70–99)
Potassium: 4.1 mmol/L (ref 3.5–5.1)
Sodium: 139 mmol/L (ref 135–145)
Total Bilirubin: 0.7 mg/dL (ref 0.3–1.2)
Total Protein: 7 g/dL (ref 6.5–8.1)

## 2020-06-07 LAB — CBC
HCT: 50.1 % (ref 39.0–52.0)
Hemoglobin: 16.5 g/dL (ref 13.0–17.0)
MCH: 32.3 pg (ref 26.0–34.0)
MCHC: 32.9 g/dL (ref 30.0–36.0)
MCV: 98 fL (ref 80.0–100.0)
Platelets: 248 10*3/uL (ref 150–400)
RBC: 5.11 MIL/uL (ref 4.22–5.81)
RDW: 10.8 % — ABNORMAL LOW (ref 11.5–15.5)
WBC: 8.3 10*3/uL (ref 4.0–10.5)
nRBC: 0 % (ref 0.0–0.2)

## 2020-06-07 LAB — LIPASE, BLOOD: Lipase: 75 U/L — ABNORMAL HIGH (ref 11–51)

## 2020-06-07 MED ORDER — SODIUM CHLORIDE 0.9% FLUSH: 3.0000 mL | Freq: Once | INTRAVENOUS | Status: DC

## 2020-06-07 NOTE — ED Triage Notes (Signed)
Patient arrives to ED with complaints of his chrons flaring up a lot lately. Patient states he would like to be on some medicine to help but does not have insurance. Pt w/o GI doc.

## 2020-06-07 NOTE — ED Notes (Signed)
Called x1 for vitals recheck with no response

## 2020-06-07 NOTE — ED Notes (Signed)
Called for vitals recheck with no response x2

## 2020-07-30 ENCOUNTER — Emergency Department
Admission: EM | Admit: 2020-07-30 | Discharge: 2020-07-30 | Disposition: A | Discharge status: Home / Self Care | Payer: No Typology Code available for payment source | Attending: Emergency Medicine | Admitting: Emergency Medicine

## 2020-07-30 ENCOUNTER — Emergency Department: Payer: No Typology Code available for payment source

## 2020-07-30 ENCOUNTER — Other Ambulatory Visit: Payer: Self-pay

## 2020-07-30 DIAGNOSIS — M7918 Myalgia, other site: Secondary | ICD-10-CM

## 2020-07-30 DIAGNOSIS — I10 Essential (primary) hypertension: Secondary | ICD-10-CM | POA: Insufficient documentation

## 2020-07-30 DIAGNOSIS — Y93I9 Activity, other involving external motion: Secondary | ICD-10-CM | POA: Insufficient documentation

## 2020-07-30 DIAGNOSIS — Y998 Other external cause status: Secondary | ICD-10-CM | POA: Insufficient documentation

## 2020-07-30 DIAGNOSIS — S161XXA Strain of muscle, fascia and tendon at neck level, initial encounter: Secondary | ICD-10-CM | POA: Diagnosis not present

## 2020-07-30 DIAGNOSIS — S199XXA Unspecified injury of neck, initial encounter: Secondary | ICD-10-CM | POA: Diagnosis present

## 2020-07-30 DIAGNOSIS — Y92410 Unspecified street and highway as the place of occurrence of the external cause: Secondary | ICD-10-CM | POA: Diagnosis not present

## 2020-07-30 DIAGNOSIS — F17210 Nicotine dependence, cigarettes, uncomplicated: Secondary | ICD-10-CM | POA: Insufficient documentation

## 2020-07-30 MED ORDER — TRAMADOL HCL 50 MG PO TABS: 50.0000 mg | ORAL_TABLET | Freq: Four times a day (QID) | ORAL | 0 refills | Status: DC | PRN

## 2020-07-30 MED ORDER — LISINOPRIL-HYDROCHLOROTHIAZIDE 10-12.5 MG PO TABS: 1.0000 | ORAL_TABLET | Freq: Every day | ORAL | 11 refills | Status: AC

## 2020-07-30 MED ORDER — IBUPROFEN 600 MG PO TABS: 600.0000 mg | ORAL_TABLET | Freq: Three times a day (TID) | ORAL | 0 refills | Status: DC | PRN

## 2020-07-30 MED ORDER — CYCLOBENZAPRINE HCL 10 MG PO TABS: 10.0000 mg | ORAL_TABLET | Freq: Three times a day (TID) | ORAL | 0 refills | Status: DC | PRN
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
## 2020-07-30 NOTE — Discharge Instructions (Addendum)
Follow discharge care instruction take medication as directed.

## 2020-08-30 ENCOUNTER — Ambulatory Visit (HOSPITAL_COMMUNITY): Payer: No Payment, Other | Admitting: Clinical

## 2020-09-08 ENCOUNTER — Emergency Department (HOSPITAL_COMMUNITY): Payer: Self-pay

## 2020-09-08 ENCOUNTER — Encounter (HOSPITAL_COMMUNITY): Payer: Self-pay | Admitting: Emergency Medicine

## 2020-09-08 ENCOUNTER — Other Ambulatory Visit: Payer: Self-pay

## 2020-09-08 ENCOUNTER — Emergency Department (HOSPITAL_COMMUNITY)
Admission: EM | Admit: 2020-09-08 | Discharge: 2020-09-08 | Disposition: A | Discharge status: Home / Self Care | Payer: Self-pay | Attending: Emergency Medicine | Admitting: Emergency Medicine

## 2020-09-08 DIAGNOSIS — S62336A Displaced fracture of neck of fifth metacarpal bone, right hand, initial encounter for closed fracture: Secondary | ICD-10-CM | POA: Insufficient documentation

## 2020-09-08 DIAGNOSIS — W228XXA Striking against or struck by other objects, initial encounter: Secondary | ICD-10-CM | POA: Insufficient documentation

## 2020-09-08 DIAGNOSIS — F17210 Nicotine dependence, cigarettes, uncomplicated: Secondary | ICD-10-CM | POA: Insufficient documentation
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
## 2020-09-08 NOTE — Progress Notes (Signed)
Orthopedic Tech Progress Note Patient Details:  Travis Goodman 29-Jan-1981 [id number]  Ortho Devices Type of Ortho Device: Ace wrap, Ulna gutter splint Ortho Device/Splint Location: RUE Ortho Device/Splint Interventions: Ordered, Application   Post Interventions Patient Tolerated: Well Instructions Provided: Care of device   Braulio Bosch 09/08/2020, 3:58 PM

## 2020-09-08 NOTE — Discharge Instructions (Signed)
Contact a health care provider if you have: Pain that gets worse or does not get better with medicine. You have redness or swelling that gets worse. A fever. A bad smell coming from under your cast or splint. Get help right away if: You have severe pain. You have trouble breathing. The following happen, even after you loosen your splint: Your hand or fingernails turn blue or gray. Your hand feels cold or numb.

## 2020-09-08 NOTE — ED Triage Notes (Addendum)
Pt states he think he may have broken his hand. States he hit his hand a few days ago and it has been swollen and painful since then. Pt states he has also been experiencing pain in both of his feet.
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
## 2022-04-12 ENCOUNTER — Ambulatory Visit: Payer: Self-pay | Admitting: Nurse Practitioner

## 2022-04-12 ENCOUNTER — Encounter: Payer: Self-pay | Admitting: Nurse Practitioner

## 2022-04-12 DIAGNOSIS — Z113 Encounter for screening for infections with a predominantly sexual mode of transmission: Secondary | ICD-10-CM

## 2022-04-12 LAB — GRAM STAIN

## 2022-04-12 LAB — HM HEPATITIS C SCREENING LAB: HM Hepatitis Screen: NEGATIVE

## 2022-04-12 LAB — HM HIV SCREENING LAB: HM HIV Screening: NEGATIVE

## 2022-04-12 LAB — HEPATITIS B SURFACE ANTIGEN: Hepatitis B Surface Ag: NONREACTIVE

## 2022-04-12 NOTE — Progress Notes (Signed)
[facility] Department STI clinic/screening visit  Subjective:  Travis Goodman is a 41 y.o. male being seen today for an STI screening visit. The patient reports they do not have symptoms.    Patient has the following medical conditions:  There are no problems to display for this patient.    Chief Complaint  Patient presents with   SEXUALLY TRANSMITTED DISEASE    Screening    HPI  Patient reports to clinic today for an STD screening.  Patient states he is asymptomatic and reports that his partner informed him that she had Syphilis.  Has not been notified by DIS as a contact or exposure to Syphilis.    Does the patient or their partner desires a pregnancy in the next year? No  Screening for MPX risk: Does the patient have an unexplained rash? No Is the patient MSM? No Does the patient endorse multiple sex partners or anonymous sex partners? No Did the patient have close or sexual contact with a person diagnosed with MPX? No Has the patient traveled outside the [place] where MPX is endemic? No Is there a high clinical suspicion for MPX-- evidenced by one of the following No  -Unlikely to be chickenpox  -Lymphadenopathy  -Rash that present in same phase of evolution on any given body part   See flowsheet for further details and programmatic requirements.    There is no immunization history on file for this patient.   The following portions of the patient's history were reviewed and updated as appropriate: allergies, current medications, past medical history, past social history, past surgical history and problem list.  Objective:  There were no vitals filed for this visit.  Physical Exam Constitutional:      Appearance: Normal appearance.  HENT:     Head: Normocephalic.     Right Ear: External ear normal.     Left Ear: External ear normal.     Nose: Nose normal.     Mouth/Throat:     Lips: Pink.     Mouth: Mucous membranes are moist.     Comments: Poor  dentition  Pulmonary:     Effort: Pulmonary effort is normal.  Abdominal:     General: Abdomen is flat.     Palpations: Abdomen is soft.  Genitourinary:    Penis: Uncircumcised.      Comments: Pubic area without nits, lice, hair loss, edema, erythema, lesions and inguinal adenopathy. Penis  without rash, lesions and discharge at meatus. Testicles descended bilaterally,nt, no masses or edema.  Musculoskeletal:     Cervical back: Full passive range of motion without pain and normal range of motion.  Lymphadenopathy:     Cervical: Cervical adenopathy present.     Left cervical: Deep cervical adenopathy present.  Skin:    General: Skin is warm and dry.  Neurological:     Mental Status: He is alert and oriented to person, place, and time.  Psychiatric:        Attention and Perception: Attention normal.        Mood and Affect: Mood normal.        Speech: Speech normal.        Behavior: Behavior normal. Behavior is cooperative.      Assessment and Plan:  Travis Goodman is a 41 y.o. male presenting to the [facility] for STI screening  1. Screening examination for venereal disease -41 year old male in clinic for STD screening.  -Patient does not have STI symptoms  Patient accepted all screenings including  oral and urethra GC, gram stain urine and bloodwork for HIV/RPR.  Patient meets criteria for HepB screening? Yes. Ordered? Yes Patient meets criteria for HepC screening? Yes. Ordered? Yes Recommended condom use with all sex Discussed importance of condom use for STI prevent  Treat gram stain per standing order Discussed time line for [facility] results and that patient will be called with positive results and encouraged patient to call if he had not heard in 2 weeks Recommended returning for continued or worsening symptoms.    - Gram stain - Gonococcus culture - Gonococcus culture - HIV/HCV [facility] - Syphilis Serology, [facility] - HBV  Antigen/Antibody [facility]  -Will await test results for Syphilis.  Currently not notified or informed as a contact to Syphilis by DIS.  Plan to be determined once results are in.     Return if symptoms worsen or fail to improve.    Gregary Cromer, FNP

## 2022-04-12 NOTE — Progress Notes (Signed)
Pt here for STD testing.  Gram stain results reviewed, no treatment required.  Condoms declined.  Windle Guard, RN

## 2022-04-16 LAB — GONOCOCCUS CULTURE

## 2022-04-21 ENCOUNTER — Telehealth: Payer: Self-pay | Admitting: Family Medicine

## 2022-04-21 NOTE — Telephone Encounter (Signed)
Patient would like to speak to a nurse about his test results.

## 2022-04-21 NOTE — Telephone Encounter (Signed)
Pt wanted to know if all of his STD results are in My Chart.  Pt informed that one test has not resulted and it will show up, in My Chart, when it has been completed.  Windle Guard, RN

## 2023-01-12 ENCOUNTER — Emergency Department: Payer: Medicaid Other

## 2023-01-12 ENCOUNTER — Emergency Department
Admission: EM | Admit: 2023-01-12 | Discharge: 2023-01-12 | Disposition: A | Discharge status: Home / Self Care | Payer: Medicaid Other | Attending: Emergency Medicine | Admitting: Emergency Medicine

## 2023-01-12 ENCOUNTER — Other Ambulatory Visit: Payer: Self-pay

## 2023-01-12 ENCOUNTER — Encounter: Payer: Self-pay | Admitting: Intensive Care

## 2023-01-12 DIAGNOSIS — F1410 Cocaine abuse, uncomplicated: Secondary | ICD-10-CM | POA: Diagnosis not present

## 2023-01-12 DIAGNOSIS — N132 Hydronephrosis with renal and ureteral calculous obstruction: Secondary | ICD-10-CM | POA: Insufficient documentation

## 2023-01-12 DIAGNOSIS — F1210 Cannabis abuse, uncomplicated: Secondary | ICD-10-CM | POA: Insufficient documentation

## 2023-01-12 DIAGNOSIS — E876 Hypokalemia: Secondary | ICD-10-CM | POA: Diagnosis not present

## 2023-01-12 DIAGNOSIS — R319 Hematuria, unspecified: Secondary | ICD-10-CM | POA: Diagnosis present

## 2023-01-12 DIAGNOSIS — N200 Calculus of kidney: Secondary | ICD-10-CM

## 2023-01-12 HISTORY — DX: Essential (primary) hypertension: I10

## 2023-01-12 LAB — CHLAMYDIA/NGC RT PCR (ARMC ONLY)
Chlamydia Tr: NOT DETECTED
N gonorrhoeae: NOT DETECTED

## 2023-01-12 LAB — CBC
HCT: 52.6 % — ABNORMAL HIGH (ref 39.0–52.0)
Hemoglobin: 17.5 g/dL — ABNORMAL HIGH (ref 13.0–17.0)
MCH: 31.3 pg (ref 26.0–34.0)
MCHC: 33.3 g/dL (ref 30.0–36.0)
MCV: 94.1 fL (ref 80.0–100.0)
Platelets: 276 10*3/uL (ref 150–400)
RBC: 5.59 MIL/uL (ref 4.22–5.81)
RDW: 11 % — ABNORMAL LOW (ref 11.5–15.5)
WBC: 8 10*3/uL (ref 4.0–10.5)
nRBC: 0 % (ref 0.0–0.2)

## 2023-01-12 LAB — URINALYSIS, ROUTINE W REFLEX MICROSCOPIC
Bacteria, UA: NONE SEEN
Bilirubin Urine: NEGATIVE
Glucose, UA: NEGATIVE mg/dL
Ketones, ur: NEGATIVE mg/dL
Leukocytes,Ua: NEGATIVE
Nitrite: NEGATIVE
Protein, ur: 100 mg/dL — AB
RBC / HPF: >50 RBC/hpf (ref 0–5)
Specific Gravity, Urine: 1.024 (ref 1.005–1.030)
Squamous Epithelial / HPF: NONE SEEN /HPF (ref 0–5)
WBC, UA: >50 WBC/hpf (ref 0–5)
pH: 5 (ref 5.0–8.0)

## 2023-01-12 LAB — URINE DRUG SCREEN, QUALITATIVE (ARMC ONLY)
Amphetamines, Ur Screen: NOT DETECTED
Barbiturates, Ur Screen: NOT DETECTED
Benzodiazepine, Ur Scrn: NOT DETECTED
Cannabinoid 50 Ng, Ur ~~LOC~~: POSITIVE — AB
Cocaine Metabolite,Ur ~~LOC~~: POSITIVE — AB
MDMA (Ecstasy)Ur Screen: NOT DETECTED
Methadone Scn, Ur: NOT DETECTED
Opiate, Ur Screen: NOT DETECTED
Phencyclidine (PCP) Ur S: NOT DETECTED
Tricyclic, Ur Screen: NOT DETECTED

## 2023-01-12 LAB — COMPREHENSIVE METABOLIC PANEL
ALT: 23 U/L (ref 0–44)
AST: 29 U/L (ref 15–41)
Albumin: 4.5 g/dL (ref 3.5–5.0)
Alkaline Phosphatase: 122 U/L (ref 38–126)
Anion gap: 10 (ref 5–15)
BUN: 12 mg/dL (ref 6–20)
CO2: 27 mmol/L (ref 22–32)
Calcium: 9.3 mg/dL (ref 8.9–10.3)
Chloride: 101 mmol/L (ref 98–111)
Creatinine, Ser: 1.41 mg/dL — ABNORMAL HIGH (ref 0.61–1.24)
GFR, Estimated: >60 mL/min (ref >60)
Glucose, Bld: 102 mg/dL — ABNORMAL HIGH (ref 70–99)
Potassium: 3.2 mmol/L — ABNORMAL LOW (ref 3.5–5.1)
Sodium: 138 mmol/L (ref 135–145)
Total Bilirubin: 1.4 mg/dL — ABNORMAL HIGH (ref 0.3–1.2)
Total Protein: 8.1 g/dL (ref 6.5–8.1)

## 2023-01-12 LAB — SEDIMENTATION RATE: Sed Rate: 2 mm/hr (ref 0–16)

## 2023-01-12 MED ORDER — ONDANSETRON 4 MG PO TBDP: 4.0000 mg | ORAL_TABLET | Freq: Three times a day (TID) | ORAL | 0 refills | Status: DC | PRN

## 2023-01-12 MED ORDER — TAMSULOSIN HCL 0.4 MG PO CAPS: 0.4000 mg | ORAL_CAPSULE | Freq: Every day | ORAL | 0 refills | Status: AC

## 2023-01-12 MED ORDER — SODIUM CHLORIDE 0.9 % IV BOLUS
1000.0000 mL | Freq: Once | INTRAVENOUS | Status: AC
  Administered 2023-01-12: 1000 mL via INTRAVENOUS

## 2023-01-12 MED ORDER — OXYCODONE-ACETAMINOPHEN 5-325 MG PO TABS: 1.0000 | ORAL_TABLET | ORAL | 0 refills | Status: AC | PRN

## 2023-01-12 MED ORDER — ONDANSETRON HCL 4 MG/2ML IJ SOLN: 4.0000 mg | Freq: Once | INTRAMUSCULAR | Status: DC

## 2023-01-12 MED ORDER — KETOROLAC TROMETHAMINE 15 MG/ML IJ SOLN
15.0000 mg | Freq: Once | INTRAMUSCULAR | Status: AC
  Administered 2023-01-12: 15 mg via INTRAVENOUS
  Filled 2023-01-12: qty 1

## 2023-01-12 NOTE — ED Notes (Signed)
Pt ambulated with all belongings and discharge papers to lobby, in NAD.

## 2023-01-12 NOTE — Discharge Instructions (Signed)
You were seen in the emergency department and diagnosed with a kidney stone.  It is importantly follow-up closely with urology.  Return to the emergency department if you have worsening symptoms.  You are also given information to get help from your cocaine use.  Also given information to establish care with a primary care provider.  Pain control:  Ibuprofen (motrin/aleve/advil) - You can take 3-4 tablets (600-800 mg) every 6 hours as needed for pain/fever.  Acetaminophen (tylenol) - You can take 2 extra strength tablets (1000 mg) every 6 hours as needed for pain/fever.  You can alternate these medications or take them together.  Make sure you eat food/drink water when taking these medications.  If you are still hurting you can take an opioid pain medication, only take if you are in severe pain.  Percocet - you were given a prescription for narcotic pain medications.   These are very addictive medications.  These medications can make you constipated.  If you need to take more than 1-2 doses, start a stool softner.  If you become constipated, take 1-2 capfull of MiraLAX mixed in 16 oz of water, can repeat untill having regular bowel movements.  Keep this medication out of reach of any children.  Flomax (tamsulosin) -this medication will make you urinate more frequently, it is importantly stay hydrated with water while taking this medication  zofran (ondansetron) - nausea medication, take 1 tablet every 8 hours as needed for nausea/vomiting.

## 2023-01-12 NOTE — ED Triage Notes (Signed)
Patient reports blood in urine this AM. C/o severe pain. Reports he had some pain a few days ago but went away.

## 2023-01-12 NOTE — ED Provider Notes (Signed)
[facility] Provider Note    Event Date/Time   First MD Initiated Contact with Patient 01/12/23 1119     (approximate)   History   Hematuria   HPI  Travis Goodman is a 42 y.o. male past medical history significant for marijuana and cocaine use, who presents to the emergency department with left-sided flank pain.  Patient endorses 2 days of pain.  Initially was having some severe pain to the left side, states that it improved and then returned earlier today.  Sharp stabbing pain to the left groin.  Also noted hematuria.  Associated with nausea and vomiting.  Dysuria.  No prior history of kidney stones.  Does endorse drinking a significant amount of sugary drinks.  Endorses a history of Crohn's disease but denies any bloody stool or diarrhea.  Denies any falls or trauma.  Also inquiring about STI testing.  Also would want tested for HIV.  States he had an HIV test done approximately 6 months ago that was negative.  Denies any significant alcohol use.     Physical Exam   Triage Vital Signs: ED Triage Vitals  Enc Vitals Group     BP 01/12/23 1113 (!) 191/112     Pulse Rate 01/12/23 1113 76     Resp 01/12/23 1113 20     Temp 01/12/23 1113 99.8 F (37.7 C)     Temp Source 01/12/23 1113 Oral     SpO2 01/12/23 1113 96 %     Weight 01/12/23 1114 180 lb (81.6 kg)     Height 01/12/23 1114 '5\' 9"'$  (1.753 m)     Head Circumference --      Peak Flow --      Pain Score 01/12/23 1114 10     Pain Loc --      Pain Edu? --      Excl. in [place]? --     Most recent vital signs: Vitals:   01/12/23 1113 01/12/23 1200  BP: (!) 191/112 (!) 153/102  Pulse: 76   Resp: 20 (!) 21  Temp: 99.8 F (37.7 C)   SpO2: 96%     Physical Exam Constitutional:      General: He is in acute distress.     Appearance: He is well-developed.  HENT:     Head: Atraumatic.  Eyes:     Conjunctiva/sclera: Conjunctivae normal.  Cardiovascular:     Rate and Rhythm: Regular rhythm.   Pulmonary:     Effort: No respiratory distress.  Abdominal:     Tenderness: There is left CVA tenderness.  Musculoskeletal:     Cervical back: Normal range of motion.  Skin:    General: Skin is warm.     Capillary Refill: Capillary refill takes less than 2 seconds.  Neurological:     Mental Status: He is alert. Mental status is at baseline.     IMPRESSION / MDM / ASSESSMENT AND PLAN / ED COURSE  I reviewed the triage vital signs and the nursing notes.  Differential diagnosis including kidney stone, pyelonephritis, STI, epididymitis.  Low suspicion for testicular torsion.  Possible Crohn's flare, will add on acute inflammatory markers.  EKG  I, Nathaniel Man, the attending physician, personally viewed and interpreted this ECG.   Rate: Normal  Rhythm: Normal sinus  Axis: Normal  Intervals: Normal  ST&T Change: None  No tachycardic or bradycardic dysrhythmias while on cardiac telemetry.  RADIOLOGY I independently reviewed imaging, my interpretation of imaging: CT scan with left-sided hydronephrosis.  Read as left-sided hydronephrosis with a 4 mm obstructing kidney stone.  LABS (all labs ordered are listed, but only abnormal results are displayed) Labs interpreted as -    Labs Reviewed  CBC - Abnormal; Notable for the following components:      Result Value   Hemoglobin 17.5 (*)    HCT 52.6 (*)    RDW 11.0 (*)    All other components within normal limits  COMPREHENSIVE METABOLIC PANEL - Abnormal; Notable for the following components:   Potassium 3.2 (*)    Glucose, Bld 102 (*)    Creatinine, Ser 1.41 (*)    Total Bilirubin 1.4 (*)    All other components within normal limits  URINALYSIS, ROUTINE W REFLEX MICROSCOPIC - Abnormal; Notable for the following components:   Color, Urine AMBER (*)    APPearance CLOUDY (*)    Hgb urine dipstick LARGE (*)    Protein, ur 100 (*)    All other components within normal limits  URINE DRUG SCREEN, QUALITATIVE ([facility] ONLY) -  Abnormal; Notable for the following components:   Cocaine Metabolite,Ur [place] POSITIVE (*)    Cannabinoid 50 Ng, Ur [place] POSITIVE (*)    All other components within normal limits  CHLAMYDIA/NGC RT PCR ([facility] ONLY)            SEDIMENTATION RATE  HIV ANTIBODY (ROUTINE TESTING W REFLEX)    TREATMENT  1 L of IV fluids, IV ketorolac  MDM    On reevaluation significant improvement of his pain.  No longer with nausea and vomiting.  States that his pain is well-controlled.  Clinical picture is most consistent with kidney stone.  Mild hypokalemia, discussed increasing potassium in his diet.  Discussed dietary changes for kidney stone.  Discussed following up with his primary care physician for his high blood pressure.  Given information to establish care with primary care provider and given information for urology follow-up.  Given information to follow-up for his cocaine abuse.  Given return precautions for any worsening symptoms.   PROCEDURES:  Critical Care performed: No  Procedures  Patient's presentation is most consistent with acute presentation with potential threat to life or bodily function.   MEDICATIONS ORDERED IN ED: Medications  ketorolac (TORADOL) 15 MG/ML injection 15 mg (15 mg Intravenous Given 01/12/23 1302)  sodium chloride 0.9 % bolus 1,000 mL (1,000 mLs Intravenous New Bag/Given 01/12/23 1305)    FINAL CLINICAL IMPRESSION(S) / ED DIAGNOSES   Final diagnoses:  Kidney stone     Rx / DC Orders   ED Discharge Orders          Ordered    ondansetron (ZOFRAN-ODT) 4 MG disintegrating tablet  Every 8 hours PRN        01/12/23 1334    oxyCODONE-acetaminophen (PERCOCET) 5-325 MG tablet  Every 4 hours PRN        01/12/23 1334    tamsulosin (FLOMAX) 0.4 MG CAPS capsule  Daily        01/12/23 1334             Note:  This document was prepared using Dragon voice recognition software and may include unintentional dictation errors.   Nathaniel Man, MD 01/12/23 1339

## 2023-03-24 ENCOUNTER — Emergency Department (HOSPITAL_BASED_OUTPATIENT_CLINIC_OR_DEPARTMENT_OTHER)
Admission: EM | Admit: 2023-03-24 | Discharge: 2023-03-24 | Disposition: A | Discharge status: Home / Self Care | Payer: Medicaid Other | Attending: Emergency Medicine | Admitting: Emergency Medicine

## 2023-03-24 ENCOUNTER — Emergency Department (HOSPITAL_BASED_OUTPATIENT_CLINIC_OR_DEPARTMENT_OTHER): Payer: Medicaid Other

## 2023-03-24 ENCOUNTER — Other Ambulatory Visit: Payer: Self-pay

## 2023-03-24 ENCOUNTER — Encounter (HOSPITAL_BASED_OUTPATIENT_CLINIC_OR_DEPARTMENT_OTHER): Payer: Self-pay

## 2023-03-24 DIAGNOSIS — M25511 Pain in right shoulder: Secondary | ICD-10-CM | POA: Diagnosis present

## 2023-03-24 DIAGNOSIS — I10 Essential (primary) hypertension: Secondary | ICD-10-CM | POA: Diagnosis not present

## 2023-03-24 MED ORDER — NAPROXEN 500 MG PO TABS: 500.0000 mg | ORAL_TABLET | Freq: Two times a day (BID) | ORAL | 0 refills | Status: AC

## 2023-03-24 MED ORDER — HYDROCODONE-ACETAMINOPHEN 5-325 MG PO TABS: 1.0000 | ORAL_TABLET | Freq: Four times a day (QID) | ORAL | 0 refills | Status: DC | PRN

## 2023-03-24 NOTE — Discharge Instructions (Addendum)
Thank you for allowing me to be part of your care today.  Your x-ray was negative for shoulder dislocation or broken bones.  There is a high chance that you have sustained an injury to your rotator cuff or other ligaments.  I provided referral information for orthopedics.  It is important that you schedule a follow-up appointment with them.  You were placed in a sling to provide support and comfort.  You may wear this as needed.  I have also sent over pain medicine to the pharmacy for you to take.  Return to the ED if you experience sudden worsening of your symptoms or if you have any new concerns.

## 2023-03-24 NOTE — ED Provider Notes (Signed)
[name] Provider Note   CSN: [id number] Arrival date & time: 03/24/23  1511     History  Chief Complaint  Patient presents with   Shoulder Injury    Travis Goodman is a 42 y.o. male with past medical history significant for hypertension, bipolar 1, paranoid schizophrenia, Crohn's and ulcerative colitis presents to the ED complaining of right shoulder pain.  Patient states he was throwing a football 2 days ago and now has a sharp pain in his right shoulder.  Patient is unable to lift his arm due to pain.  Patient also reports he occasionally has tingling in his fingers.  Denies falling on his shoulder.        Home Medications Prior to Admission medications   Medication Sig Start Date End Date Taking? Authorizing Provider  HYDROcodone-acetaminophen (NORCO/VICODIN) 5-325 MG tablet Take 1-2 tablets by mouth every 6 (six) hours as needed. 03/24/23  Yes Reinette Cuneo R, PA-C  naproxen (NAPROSYN) 500 MG tablet Take 1 tablet (500 mg total) by mouth 2 (two) times daily. 03/24/23  Yes Itha Kroeker R, PA-C  lisinopril-hydrochlorothiazide (ZESTORETIC) 10-12.5 MG tablet Take 1 tablet by mouth daily. 07/30/20 07/30/21  Joni Reining, PA-C  ondansetron (ZOFRAN-ODT) 4 MG disintegrating tablet Take 1 tablet (4 mg total) by mouth every 8 (eight) hours as needed for nausea or vomiting. 01/12/23   Corena Herter, MD      Allergies    Patient has no known allergies.    Review of Systems   Review of Systems  Musculoskeletal:  Positive for arthralgias (R shoulder pain). Negative for joint swelling.  Neurological:  Negative for weakness and numbness.    Physical Exam Updated Vital Signs BP (!) 161/101 (BP Location: Right Arm)   Pulse 95   Temp 98.6 F (37 C) (Oral)   Resp 18   Ht 5\' 9"  (1.753 m)   Wt 90.7 kg   SpO2 97%   BMI 29.53 kg/m  Physical Exam Vitals and nursing note reviewed.  Constitutional:      General: He is not in acute  distress.    Appearance: Normal appearance. He is not ill-appearing or diaphoretic.  Cardiovascular:     Rate and Rhythm: Normal rate and regular rhythm.  Pulmonary:     Effort: Pulmonary effort is normal.  Musculoskeletal:     Right shoulder: Tenderness and bony tenderness present. No swelling, deformity or effusion. Decreased range of motion.     Comments: Patient with severely reduced shoulder flexion on the right.  Normal shoulder extension.  Patient also has severe pain with abduction and adduction.  No obvious deformity to the right shoulder.  Patient is generally tender over the right shoulder.  Right clavicle is unremarkable.  Neurological:     Mental Status: He is alert. Mental status is at baseline.  Psychiatric:        Mood and Affect: Mood normal.        Behavior: Behavior normal.     ED Results / Procedures / Treatments   Labs (all labs ordered are listed, but only abnormal results are displayed) Labs Reviewed - No data to display  EKG None  Radiology DG Shoulder Right  Result Date: 03/24/2023 CLINICAL DATA:  Right shoulder pain, injury EXAM: RIGHT SHOULDER - 2+ VIEW COMPARISON:  None Available. FINDINGS: Frontal, transscapular, and axillary views of the right shoulder are obtained on 4 images. No acute fracture, subluxation, or dislocation. Mild hypertrophic changes of the acromioclavicular  joint. Soft tissues are unremarkable. Right chest is clear. IMPRESSION: 1. No acute fracture. 2. Mild acromioclavicular joint osteoarthritis. Electronically Signed   By: Sharlet [place] M.D.   On: 03/24/2023 16:02    Procedures Procedures    Medications Ordered in ED Medications - No data to display  ED Course/ Medical Decision Making/ A&P                             Medical Decision Making Amount and/or Complexity of Data Reviewed Radiology: ordered.   This patient presents to the ED with chief complaint(s) of right shoulder pain that began after throwing a  football.  The differential diagnosis includes rotator cuff tear, other ligamentous injury, shoulder dislocation, acute fracture, osteoarthritis  Initial Assessment:   Exam significant for generalized tenderness over the right shoulder.  Patient with severely reduced shoulder flexion.  Normal shoulder extension.  Patient does also have severe pain with abduction and adduction of the right arm.  Radial is 2+.  Skin is warm and dry without any overlying changes.  No obvious gross deformities to the right shoulder or clavicle.  Independent visualization and interpretation of imaging: I independently visualized the following imaging with scope of interpretation limited to determining acute life threatening conditions related to emergency care: Right shoulder x-ray, which revealed AC osteoarthritis, no acute dislocation or fracture.  I agree with radiologist interpretation.  Treatment and Reassessment: Patient placed in a shoulder sling to help with comfort.  Suspect that his symptoms may be related to rotator cuff tear or other ligamentous injury which we cannot see on x-ray.  Disposition:   Patient referred to orthopedics for follow-up.  Will send him home on a short course of anti-inflammatories and pain medicine.  Discussed with patient high suspicion for ligamentous injury and importance of follow-up.  The patient has been appropriately medically screened and/or stabilized in the ED. I have low suspicion for any other emergent medical condition which would require further screening, evaluation or treatment in the ED or require inpatient management. At time of discharge the patient is hemodynamically stable and in no acute distress. I have discussed work-up results and diagnosis with patient and answered all questions. Patient is agreeable with discharge plan. We discussed strict return precautions for returning to the emergency department and they verbalized understanding.    Social Determinants of  Health:   Patient's  tobacco abuse   increases the complexity of managing their presentation         Final Clinical Impression(s) / ED Diagnoses Final diagnoses:  Acute pain of right shoulder    Rx / DC Orders ED Discharge Orders          Ordered    HYDROcodone-acetaminophen (NORCO/VICODIN) 5-325 MG tablet  Every 6 hours PRN        03/24/23 1724    naproxen (NAPROSYN) 500 MG tablet  2 times daily        03/24/23 1724              Lenard Simmer, PA-C 03/24/23 1725    Mardene Sayer, MD 03/25/23 1208

## 2023-03-24 NOTE — ED Notes (Signed)
Pt refused icepack.

## 2023-03-24 NOTE — ED Triage Notes (Signed)
Patient was throwing the football two days ago and now has right shoulder pain.

## 2023-05-15 ENCOUNTER — Emergency Department (HOSPITAL_BASED_OUTPATIENT_CLINIC_OR_DEPARTMENT_OTHER)
Admission: EM | Admit: 2023-05-15 | Discharge: 2023-05-15 | Disposition: A | Discharge status: Home / Self Care | Payer: MEDICAID | Attending: Emergency Medicine | Admitting: Emergency Medicine

## 2023-05-15 ENCOUNTER — Encounter (HOSPITAL_BASED_OUTPATIENT_CLINIC_OR_DEPARTMENT_OTHER): Payer: Self-pay | Admitting: Pediatrics

## 2023-05-15 ENCOUNTER — Other Ambulatory Visit (HOSPITAL_BASED_OUTPATIENT_CLINIC_OR_DEPARTMENT_OTHER): Payer: Self-pay

## 2023-05-15 ENCOUNTER — Other Ambulatory Visit: Payer: Self-pay

## 2023-05-15 DIAGNOSIS — S80211A Abrasion, right knee, initial encounter: Secondary | ICD-10-CM

## 2023-05-15 DIAGNOSIS — W19XXXA Unspecified fall, initial encounter: Secondary | ICD-10-CM | POA: Diagnosis not present

## 2023-05-15 DIAGNOSIS — M25511 Pain in right shoulder: Secondary | ICD-10-CM | POA: Insufficient documentation

## 2023-05-15 DIAGNOSIS — R300 Dysuria: Secondary | ICD-10-CM

## 2023-05-15 DIAGNOSIS — I1A0 Resistant hypertension: Secondary | ICD-10-CM

## 2023-05-15 DIAGNOSIS — Z79899 Other long term (current) drug therapy: Secondary | ICD-10-CM | POA: Diagnosis not present

## 2023-05-15 DIAGNOSIS — I10 Essential (primary) hypertension: Secondary | ICD-10-CM | POA: Insufficient documentation

## 2023-05-15 DIAGNOSIS — S8991XA Unspecified injury of right lower leg, initial encounter: Secondary | ICD-10-CM | POA: Diagnosis present

## 2023-05-15 DIAGNOSIS — R519 Headache, unspecified: Secondary | ICD-10-CM | POA: Insufficient documentation

## 2023-05-15 LAB — URINALYSIS, ROUTINE W REFLEX MICROSCOPIC
Glucose, UA: NEGATIVE mg/dL
Ketones, ur: NEGATIVE mg/dL
Leukocytes,Ua: NEGATIVE
Nitrite: NEGATIVE
Protein, ur: 100 mg/dL — AB
Specific Gravity, Urine: >=1.03 (ref 1.005–1.030)
pH: 6 (ref 5.0–8.0)

## 2023-05-15 LAB — URINALYSIS, MICROSCOPIC (REFLEX)

## 2023-05-15 MED ORDER — BACITRACIN ZINC 500 UNIT/GM EX OINT
TOPICAL_OINTMENT | Freq: Two times a day (BID) | CUTANEOUS | Status: DC
  Administered 2023-05-15: 31.5 via TOPICAL
  Filled 2023-05-15: qty 28.35

## 2023-05-15 MED ORDER — KETOROLAC TROMETHAMINE 60 MG/2ML IM SOLN
60.0000 mg | Freq: Once | INTRAMUSCULAR | Status: AC
  Administered 2023-05-15: 60 mg via INTRAMUSCULAR
  Filled 2023-05-15: qty 2

## 2023-05-15 MED ORDER — AMLODIPINE BESYLATE 5 MG PO TABS
5.0000 mg | ORAL_TABLET | Freq: Every day | ORAL | 2 refills | Status: AC
  Filled 2023-05-15: qty 30, 30d supply, fill #0

## 2023-05-15 NOTE — ED Notes (Signed)
Patient reports BP issues but does not take any medications for his BP. MD made aware of patients BP

## 2023-05-15 NOTE — ED Notes (Signed)
Inquired about getting screen for "diseases" but denies symptoms when asked.

## 2023-05-15 NOTE — ED Triage Notes (Signed)
C/O headache x3 days; sharp and stabbing in nature on bilateral temporal side radiates to the back of head. Denies any recent head injury. Denies light sensitivity, -NV.   C/O right sided shoulder pain, endorsed hx of rotator cuff injury and pain upon raising arm.   C/O scrape on right knee when he fell in the street 3 days ago.

## 2023-05-15 NOTE — ED Provider Notes (Signed)
[place] [facility] Provider Note   CSN: [id number] Arrival date & time: 05/15/23  4098     History  Chief Complaint  Patient presents with   Shoulder Pain   Headache   Knee Pain    Travis Goodman is a 42 y.o. male.  He is here with various complaints.  He said he hurt his right shoulder a long time ago and it has been acting up for the past few weeks.  He thinks it is his rotator cuff.  He injured it playing with his nephew.  Now he has pain whenever he tries to lift his arm up in the air.  He is also complaining of 3 days of a sharp headache.  Not associate with any blurry vision double vision numbness weakness.  He thinks it may be because of his shoulder.  He is also had a little bit of difficulty passing urine.  He has had a kidney stone in the past and wonders if it may be due to a kidney stone or an STD.  He also has an abrasion on his right knee.  He said he fell on the street and scraped it a few days ago.  He thinks it may be infected now.  He has tried nothing for his symptoms.  The history is provided by the patient.  Shoulder Pain Location:  Shoulder Shoulder location:  R shoulder Injury: yes   Pain details:    Quality:  Sharp   Progression:  Unchanged Relieved by:  None tried Worsened by:  Movement Ineffective treatments:  None tried Associated symptoms: no fever, no numbness and no tingling   Headache Associated symptoms: no abdominal pain and no fever   Knee Pain Associated symptoms: no fever, no numbness and no tingling        Home Medications Prior to Admission medications   Medication Sig Start Date End Date Taking? Authorizing Provider  HYDROcodone-acetaminophen (NORCO/VICODIN) 5-325 MG tablet Take 1-2 tablets by mouth every 6 (six) hours as needed. 03/24/23   Clark, Meghan R, PA-C  lisinopril-hydrochlorothiazide (ZESTORETIC) 10-12.5 MG tablet Take 1 tablet by mouth daily. 07/30/20 07/30/21  Joni Reining, PA-C   naproxen (NAPROSYN) 500 MG tablet Take 1 tablet (500 mg total) by mouth 2 (two) times daily. 03/24/23   Clark, Meghan R, PA-C  ondansetron (ZOFRAN-ODT) 4 MG disintegrating tablet Take 1 tablet (4 mg total) by mouth every 8 (eight) hours as needed for nausea or vomiting. 01/12/23   Corena Herter, MD      Allergies    Patient has no known allergies.    Review of Systems   Review of Systems  Constitutional:  Negative for fever.  Eyes:  Negative for visual disturbance.  Respiratory:  Negative for shortness of breath.   Cardiovascular:  Negative for chest pain.  Gastrointestinal:  Negative for abdominal pain.  Genitourinary:  Positive for difficulty urinating.  Skin:  Positive for wound.  Neurological:  Positive for headaches.    Physical Exam Updated Vital Signs BP (!) 182/123 (BP Location: Left Arm)   Pulse 99   Temp 98.1 F (36.7 C) (Oral)   Resp 18   Ht 5\' 9"  (1.753 m)   Wt 90.7 kg   SpO2 98%   BMI 29.53 kg/m  Physical Exam Vitals and nursing note reviewed.  Constitutional:      General: He is not in acute distress.    Appearance: He is well-developed.  HENT:  Head: Normocephalic and atraumatic.  Eyes:     Conjunctiva/sclera: Conjunctivae normal.  Cardiovascular:     Rate and Rhythm: Normal rate and regular rhythm.     Heart sounds: No murmur heard. Pulmonary:     Effort: Pulmonary effort is normal. No respiratory distress.     Breath sounds: Normal breath sounds.  Abdominal:     Palpations: Abdomen is soft.     Tenderness: There is no abdominal tenderness.  Musculoskeletal:        General: Tenderness and signs of injury present. No swelling.     Cervical back: Neck supple.     Comments: He has some diffuse tenderness around his right shoulder.  Normal internal/external rotation.  Has pain with abduction.  Normal landmarks.  He has a scab over his right knee.  There is no fluctuance or induration.  No knee swelling.  Skin:    General: Skin is warm and dry.      Capillary Refill: Capillary refill takes less than 2 seconds.  Neurological:     General: No focal deficit present.     Mental Status: He is alert.     GCS: GCS eye subscore is 4. GCS verbal subscore is 5. GCS motor subscore is 6.     Cranial Nerves: No cranial nerve deficit.     Sensory: No sensory deficit.     Motor: No weakness.     ED Results / Procedures / Treatments   Labs (all labs ordered are listed, but only abnormal results are displayed) Labs Reviewed  URINALYSIS, ROUTINE W REFLEX MICROSCOPIC - Abnormal; Notable for the following components:      Result Value   Hgb urine dipstick SMALL (*)    Bilirubin Urine SMALL (*)    Protein, ur 100 (*)    All other components within normal limits  URINALYSIS, MICROSCOPIC (REFLEX) - Abnormal; Notable for the following components:   Bacteria, UA RARE (*)    All other components within normal limits  GC/CHLAMYDIA PROBE AMP ([place]) NOT AT [facility]    EKG None  Radiology No results found.  Procedures Procedures    Medications Ordered in ED Medications  ketorolac (TORADOL) injection 60 mg (has no administration in time range)  bacitracin ointment (has no administration in time range)    ED Course/ Medical Decision Making/ A&P Clinical Course as of 05/15/23 1648  Tue May 15, 2023  0909 Patient's blood pressure elevated here.  On review of prior health visits blood pressure seems to be consistently elevated. [MB]  1017 Patient's symptoms are improved and blood pressure is still elevated but trending down a little bit.  Will start him on Norvasc. [MB]    Clinical Course User Index [MB] Terrilee Files, MD                             Medical Decision Making Amount and/or Complexity of Data Reviewed Labs: ordered.  Risk OTC drugs. Prescription drug management.   This patient complains of various complaints; this involves an extensive number of treatment Options and is a complaint that carries with it a high  risk of complications and morbidity. The differential includes rotator cuff injury, arthritis, dislocation, hypertensive urgency/emergency, bowel ischemia, gastritis, cellulitis, abscess  I ordered, reviewed and interpreted labs, which included urinalysis negative GC chlamydia pending I ordered medication IM Toradol, bacitracin and reviewed PMP when indicated. Previous records obtained and reviewed in epic, Patient has various  ED presentations, no recent admissions. Social determinants considered, tobacco use Critical Interventions: None  After the interventions stated above, I reevaluated the patient and found patient's symptoms to be improved in no distress Admission and further testing considered, no indications for admission or further workup at this time.  Due to his persistent hypertension and looks like he is always elevated, in the ED will start him on antihypertensives.  Not sure that he has a PCP to follow-up with.  Given talk contact information to establish care with PCP and an orthopedic follow-up.  Return instructions discussed.         Final Clinical Impression(s) / ED Diagnoses Final diagnoses:  Generalized headache  Acute pain of right shoulder  Resistant hypertension  Abrasion, right knee, initial encounter  Dysuria    Rx / DC Orders ED Discharge Orders          Ordered    amLODipine (NORVASC) 5 MG tablet  Daily        05/15/23 1015              Terrilee Files, MD 05/15/23 1651

## 2023-05-15 NOTE — Discharge Instructions (Signed)
You were seen in the emergency department for headache right shoulder pain urinary symptoms abrasion right knee.  Your urinalysis did not show any obvious signs of infection and is being tested for gonorrhea and chlamydia.  We will call you if your test require requires more treatment.  You can use the bacitracin for your knee wound.  We are starting you on some blood pressure medication for your elevated blood pressure.  It will be important for you to get a primary care doctor and we are also giving you contact information for an orthopedic doctor for your shoulder.

## 2023-05-16 ENCOUNTER — Other Ambulatory Visit (HOSPITAL_BASED_OUTPATIENT_CLINIC_OR_DEPARTMENT_OTHER): Payer: Self-pay

## 2023-05-16 LAB — GC/CHLAMYDIA PROBE AMP (~~LOC~~) NOT AT ARMC
Chlamydia: NEGATIVE
Comment: NEGATIVE
Comment: NORMAL
Neisseria Gonorrhea: NEGATIVE

## 2023-07-01 ENCOUNTER — Emergency Department (HOSPITAL_COMMUNITY)
Admission: EM | Admit: 2023-07-01 | Discharge: 2023-07-01 | Disposition: A | Discharge status: Home / Self Care | Payer: MEDICAID | Attending: Emergency Medicine | Admitting: Emergency Medicine

## 2023-07-01 ENCOUNTER — Encounter (HOSPITAL_COMMUNITY): Payer: Self-pay

## 2023-07-01 ENCOUNTER — Emergency Department (HOSPITAL_COMMUNITY): Payer: MEDICAID

## 2023-07-01 ENCOUNTER — Other Ambulatory Visit: Payer: Self-pay

## 2023-07-01 DIAGNOSIS — N200 Calculus of kidney: Secondary | ICD-10-CM | POA: Diagnosis not present

## 2023-07-01 DIAGNOSIS — R109 Unspecified abdominal pain: Secondary | ICD-10-CM | POA: Diagnosis present

## 2023-07-01 DIAGNOSIS — Z79899 Other long term (current) drug therapy: Secondary | ICD-10-CM | POA: Diagnosis not present

## 2023-07-01 DIAGNOSIS — R7401 Elevation of levels of liver transaminase levels: Secondary | ICD-10-CM | POA: Insufficient documentation

## 2023-07-01 LAB — URINALYSIS, ROUTINE W REFLEX MICROSCOPIC
Bilirubin Urine: NEGATIVE
Glucose, UA: NEGATIVE mg/dL
Ketones, ur: NEGATIVE mg/dL
Leukocytes,Ua: NEGATIVE
Nitrite: NEGATIVE
Protein, ur: 100 mg/dL — AB
RBC / HPF: >50 RBC/hpf (ref 0–5)
Specific Gravity, Urine: 1.023 (ref 1.005–1.030)
pH: 5 (ref 5.0–8.0)

## 2023-07-01 LAB — CBC
HCT: 49 % (ref 39.0–52.0)
Hemoglobin: 16.4 g/dL (ref 13.0–17.0)
MCH: 31.5 pg (ref 26.0–34.0)
MCHC: 33.5 g/dL (ref 30.0–36.0)
MCV: 94 fL (ref 80.0–100.0)
Platelets: 242 10*3/uL (ref 150–400)
RBC: 5.21 MIL/uL (ref 4.22–5.81)
RDW: 11 % — ABNORMAL LOW (ref 11.5–15.5)
WBC: 10.4 10*3/uL (ref 4.0–10.5)
nRBC: 0 % (ref 0.0–0.2)

## 2023-07-01 LAB — COMPREHENSIVE METABOLIC PANEL
ALT: 17 U/L (ref 0–44)
AST: 24 U/L (ref 15–41)
Albumin: 3.9 g/dL (ref 3.5–5.0)
Alkaline Phosphatase: 118 U/L (ref 38–126)
Anion gap: 9 (ref 5–15)
BUN: 10 mg/dL (ref 6–20)
CO2: 25 mmol/L (ref 22–32)
Calcium: 9.2 mg/dL (ref 8.9–10.3)
Chloride: 103 mmol/L (ref 98–111)
Creatinine, Ser: 1.46 mg/dL — ABNORMAL HIGH (ref 0.61–1.24)
GFR, Estimated: >60 mL/min (ref >60)
Glucose, Bld: 113 mg/dL — ABNORMAL HIGH (ref 70–99)
Potassium: 3.5 mmol/L (ref 3.5–5.1)
Sodium: 137 mmol/L (ref 135–145)
Total Bilirubin: 1 mg/dL (ref 0.3–1.2)
Total Protein: 7 g/dL (ref 6.5–8.1)

## 2023-07-01 LAB — LIPASE, BLOOD: Lipase: 134 U/L — ABNORMAL HIGH (ref 11–51)

## 2023-07-01 MED ORDER — HYDROCODONE-ACETAMINOPHEN 5-325 MG PO TABS: 2.0000 | ORAL_TABLET | ORAL | 0 refills | Status: DC | PRN

## 2023-07-01 MED ORDER — TAMSULOSIN HCL 0.4 MG PO CAPS
0.4000 mg | ORAL_CAPSULE | Freq: Every day | ORAL | Status: DC
  Administered 2023-07-01: 0.4 mg via ORAL
  Filled 2023-07-01: qty 1

## 2023-07-01 MED ORDER — SODIUM CHLORIDE 0.9 % IV BOLUS
500.0000 mL | Freq: Once | INTRAVENOUS | Status: AC
  Administered 2023-07-01: 500 mL via INTRAVENOUS

## 2023-07-01 MED ORDER — ONDANSETRON HCL 4 MG PO TABS: 4.0000 mg | ORAL_TABLET | Freq: Four times a day (QID) | ORAL | 0 refills | Status: DC | PRN

## 2023-07-01 MED ORDER — HYDROMORPHONE HCL 1 MG/ML IJ SOLN
1.0000 mg | Freq: Once | INTRAMUSCULAR | Status: AC
  Administered 2023-07-01: 1 mg via INTRAVENOUS
  Filled 2023-07-01: qty 1

## 2023-07-01 MED ORDER — ONDANSETRON HCL 4 MG/2ML IJ SOLN
4.0000 mg | Freq: Once | INTRAMUSCULAR | Status: AC
  Administered 2023-07-01: 4 mg via INTRAVENOUS
  Filled 2023-07-01: qty 2

## 2023-07-01 MED ORDER — IOHEXOL 350 MG/ML SOLN
75.0000 mL | Freq: Once | INTRAVENOUS | Status: AC | PRN
  Administered 2023-07-01: 75 mL via INTRAVENOUS

## 2023-07-01 MED ORDER — TAMSULOSIN HCL 0.4 MG PO CAPS: 0.4000 mg | ORAL_CAPSULE | Freq: Every day | ORAL | 0 refills | Status: DC

## 2023-07-01 NOTE — ED Notes (Signed)
Patient transported to Ultrasound 

## 2023-07-01 NOTE — ED Notes (Signed)
PT pulled  all of his leads off and is sleeping at this time.

## 2023-07-01 NOTE — ED Provider Notes (Signed)
[place] EMERGENCY DEPARTMENT AT [facility] Provider Note   CSN: [id number] Arrival date & time: 07/01/23  4401     History  Chief Complaint  Patient presents with   Abdominal Pain    Travis Goodman is a 42 y.o. male who presents 3 hours after sudden onset of left testicular pain that woke him from his sleep.  Did have intercourse needed prior to falling to sleep.  No skin changes.  No history of similar pain in the past.  Does have a history of Crohn's disease diagnosed in [place].  No medications daily.  Patient writhing around in pain, low appetite due to acuity of presentation upon arrival.  HPI     Home Medications Prior to Admission medications   Medication Sig Start Date End Date Taking? Authorizing Provider  amLODipine (NORVASC) 5 MG tablet Take 1 tablet (5 mg total) by mouth daily. 05/15/23   Terrilee Files, MD  HYDROcodone-acetaminophen (NORCO/VICODIN) 5-325 MG tablet Take 1-2 tablets by mouth every 6 (six) hours as needed. 03/24/23   Clark, Meghan R, PA-C  lisinopril-hydrochlorothiazide (ZESTORETIC) 10-12.5 MG tablet Take 1 tablet by mouth daily. 07/30/20 07/30/21  Joni Reining, PA-C  naproxen (NAPROSYN) 500 MG tablet Take 1 tablet (500 mg total) by mouth 2 (two) times daily. 03/24/23   Clark, Meghan R, PA-C  ondansetron (ZOFRAN-ODT) 4 MG disintegrating tablet Take 1 tablet (4 mg total) by mouth every 8 (eight) hours as needed for nausea or vomiting. 01/12/23   Corena Herter, MD      Allergies    Patient has no known allergies.    Review of Systems   Review of Systems  Constitutional: Negative.   HENT: Negative.    Gastrointestinal:  Positive for abdominal pain and nausea.  Genitourinary:  Positive for testicular pain. Negative for penile swelling and scrotal swelling.    Physical Exam Updated Vital Signs BP (!) 176/116 (BP Location: Right Arm)   Pulse 76   Temp 98.6 F (37 C) (Oral)   Resp 18   Ht 5\' 9"  (1.753 m)   Wt 90.7 kg   SpO2 97%   BMI  29.53 kg/m  Physical Exam Vitals and nursing note reviewed. Exam conducted with a chaperone present Administrator).  Constitutional:      General: He is in acute distress (Pain).     Appearance: He is not toxic-appearing.  HENT:     Head: Normocephalic and atraumatic.     Mouth/Throat:     Mouth: Mucous membranes are moist.     Pharynx: No oropharyngeal exudate or posterior oropharyngeal erythema.  Eyes:     General:        Right eye: No discharge.        Left eye: No discharge.     Conjunctiva/sclera: Conjunctivae normal.  Cardiovascular:     Rate and Rhythm: Normal rate and regular rhythm.     Pulses: Normal pulses.  Pulmonary:     Effort: Pulmonary effort is normal. No respiratory distress.     Breath sounds: Normal breath sounds. No wheezing or rales.  Abdominal:     General: Bowel sounds are normal. There is no distension.     Tenderness: There is no abdominal tenderness.     Hernia: There is no hernia in the left inguinal area.  Genitourinary:    Penis: Uncircumcised.      Testes:        Left: Tenderness and swelling present.     Epididymis:  Right: Normal.     Left: Normal.     Comments: Left testicle high riding, transverse testicular lie, absent cremasteric reflex on the left Musculoskeletal:        General: No deformity.     Cervical back: Neck supple.  Skin:    General: Skin is warm and dry.  Neurological:     Mental Status: He is alert. Mental status is at baseline.  Psychiatric:        Mood and Affect: Mood normal.     ED Results / Procedures / Treatments   Labs (all labs ordered are listed, but only abnormal results are displayed) Labs Reviewed  LIPASE, BLOOD - Abnormal; Notable for the following components:      Result Value   Lipase 134 (*)    All other components within normal limits  COMPREHENSIVE METABOLIC PANEL - Abnormal; Notable for the following components:   Glucose, Bld 113 (*)    Creatinine, Ser 1.46 (*)    All other components within  normal limits  CBC - Abnormal; Notable for the following components:   RDW 11.0 (*)    All other components within normal limits  URINALYSIS, ROUTINE W REFLEX MICROSCOPIC - Abnormal; Notable for the following components:   APPearance HAZY (*)    Hgb urine dipstick LARGE (*)    Protein, ur 100 (*)    Bacteria, UA RARE (*)    All other components within normal limits    EKG None  Radiology US SCROTUM W/DOPPLER  Result Date: 07/01/2023 CLINICAL DATA:  Testicle pain EXAM: SCROTAL ULTRASOUND DOPPLER ULTRASOUND OF THE TESTICLES TECHNIQUE: Complete ultrasound examination of the testicles, epididymis, and other scrotal structures was performed. Color and spectral Doppler ultrasound were also utilized to evaluate blood flow to the testicles. COMPARISON:  None Available. FINDINGS: Right testicle Measurements: 44 x 24 x 35 mm.  No mass or abnormal vascularity. Left testicle Measurements: 45 x 31 x 26 mm.  No mass or abnormal vascularity. Right epididymis:  Normal in size and appearance. Left epididymis:  Normal in size and appearance. Hydrocele:  None visualized. Varicocele:  None visualized. Pulsed Doppler interrogation of both testes demonstrates normal low resistance arterial and venous waveforms bilaterally. IMPRESSION: Unremarkable scrotal ultrasound.  No explanation for pain. Electronically Signed   By: Tiburcio Pea M.D.   On: 07/01/2023 06:42    Procedures Procedures    Medications Ordered in ED Medications  HYDROmorphone (DILAUDID) injection 1 mg (1 mg Intravenous Given 07/01/23 0603)  ondansetron (ZOFRAN) injection 4 mg (4 mg Intravenous Given 07/01/23 0603)    ED Course/ Medical Decision Making/ A&P Clinical Course as of 07/01/23 1610  [facility] Jul 01, 2023  0640 Acute onset lower abdominal pain this morning. Left lower abdominal pain, left testicular pain. Testicle transverse lie and high riding, no cremasteric reflex. Pretty confident its a torsion. When read, call urology. [CG]  0643  2:30 to 3:00 AM [CG]    Clinical Course User Index [CG] Al Decant, PA-C                                 Medical Decision Making 42 year old male presents with concern for left testicular pain x 3 hours.  Exquisitely hypertensive and exceedingly uncomfortable at time of exam, writhing around in the bed.  Tearful.  High riding transverse lie of the left testicle with mild swelling, significant clinical concern for testicular torsion.  Differential diagnosis includes  is limited to testicular torsion, cellulitis, testicular abscess, Fournier's gangrene, hematocele, spermatocele, hydrocele, varicocele, inguinal hernia, orchitis, malignancy.  Amount and/or Complexity of Data Reviewed Labs: ordered.    Details: CBC unremarkable, CMP with creatinine of 1.4, lipase elevated to 134, UA with large hemoglobin protein but not concerning for infection. Radiology: ordered.  Risk Prescription drug management.   Care of this patient signed out to oncoming ED provider Jannifer Hick, PA-C at time of shift change. All pertinent HPI, physical exam, and laboratory findings were discussed with them prior to my departure. Disposition of patient pending completion of workup, reevaluation, and clinical judgement of oncoming ED provider.   This chart was dictated using voice recognition software, Dragon. Despite the best efforts of this provider to proofread and correct errors, errors may still occur which can change documentation meaning.  Final Clinical Impression(s) / ED Diagnoses Final diagnoses:  None    Rx / DC Orders ED Discharge Orders     None         Sherrilee Gilles 07/01/23 3875    Zadie Rhine, MD 07/01/23 [phone]

## 2023-07-01 NOTE — Discharge Instructions (Signed)
It was a pleasure taking part in your care today.  As we discussed, you do have a left-sided 4 mm kidney stone.  This is the cause of your pain.  Please continue hydrating yourself with plenty of fluids, water over the next few days.  Please follow-up with urology utilizing the contact information provided to you here.  If you develop any new symptoms such as fevers, intractable nausea and vomiting, inability to urinate please return to the ED for further management.  Please begin taking Zofran every 6 hours as needed for nausea and vomiting.  Please begin taking Flomax once a day in the morning to help pass the stone.  Please take hydrocodone pain medication every 6 hours as needed for pain.  Please do not drive or operate heavy machinery while taking pain medication.

## 2023-07-01 NOTE — ED Triage Notes (Signed)
Pt to ED from home with c/o lower abdominal pain. Pt states he believes he is having a crohns flare up, but that it has never been this bad before. Pt is reluctant to answer questions in triage due to pain level. Arrives A+O, VSS.

## 2023-07-01 NOTE — ED Provider Notes (Signed)
  Physical Exam  BP (!) 167/124   Pulse 65   Temp 98 F (36.7 C)   Resp 18   Ht 5\' 9"  (1.753 m)   Wt 90.7 kg   SpO2 98%   BMI 29.53 kg/m   Physical Exam  Procedures  Procedures  ED Course / MDM   Clinical Course as of 07/01/23 1004  Sun Jul 01, 2023  1610 Acute onset lower abdominal pain this morning. Left lower abdominal pain, left testicular pain. Testicle transverse lie and high riding, no cremasteric reflex. Pretty confident its a torsion. When read, call urology. [CG]  0643 2:30 to 3:00 AM [CG]    Clinical Course User Index [CG] Al Decant, PA-C   Medical Decision Making Amount and/or Complexity of Data Reviewed Labs: ordered. Radiology: ordered.  Risk Prescription drug management.   Patient signed out at shift change pending ultrasound imaging of scrotum.  Please see previous provider note for further details of the event.  In short, this is a 42 year old male who woke up at 230 or 3 AM this morning with sudden onset of left lower quadrant abdominal pain, left groin pain.  Previous provider concern for testicular torsion.  Ultrasound imaging shows no evidence of testicle or torsion.  Will proceed to CT abdomen pelvis with contrast to assess for further causes of patient pain.  CBC shows no leukocytosis, no anemia.  Metabolic panel shows baseline creatinine 1.46, no electrolyte derangement, no elevated anion gap.  Patient lipase 134 however not concerning for acute pancreatitis.  Patient urinalysis shows large hemoglobin, hazy urine.  CT abdomen pelvis shows 4mm left-sided stone, most likely cause for patient's symptoms.  Patient given Flomax, 500 mL of fluid.  Reassessment, patient resting comfortably.  Patient we discharged home at this time.  Patient will be discharged with tamsulosin, Zofran, hydrocodone pain medication.  Patient will be given referral to urology.  He was encouraged to follow-up with urology.  He was advised to return to the ED with any  new or worsening signs or symptoms and he voiced understanding.  All questions answered to the patient satisfaction.  He is stable to discharge at this time.       Al Decant, PA-C 07/01/23 1004    Jacalyn Lefevre, MD 07/01/23 1044

## 2023-07-01 NOTE — ED Notes (Signed)
Patient given food and drink.

## 2023-08-15 ENCOUNTER — Encounter (HOSPITAL_BASED_OUTPATIENT_CLINIC_OR_DEPARTMENT_OTHER): Payer: Self-pay | Admitting: Pediatrics

## 2023-08-15 ENCOUNTER — Emergency Department (HOSPITAL_BASED_OUTPATIENT_CLINIC_OR_DEPARTMENT_OTHER)
Admission: EM | Admit: 2023-08-15 | Discharge: 2023-08-15 | Disposition: A | Discharge status: Home / Self Care | Payer: MEDICAID | Attending: Emergency Medicine | Admitting: Emergency Medicine

## 2023-08-15 ENCOUNTER — Other Ambulatory Visit: Payer: Self-pay

## 2023-08-15 ENCOUNTER — Other Ambulatory Visit (HOSPITAL_BASED_OUTPATIENT_CLINIC_OR_DEPARTMENT_OTHER): Payer: Self-pay

## 2023-08-15 DIAGNOSIS — R319 Hematuria, unspecified: Secondary | ICD-10-CM | POA: Diagnosis not present

## 2023-08-15 DIAGNOSIS — I10 Essential (primary) hypertension: Secondary | ICD-10-CM | POA: Insufficient documentation

## 2023-08-15 DIAGNOSIS — R300 Dysuria: Secondary | ICD-10-CM | POA: Diagnosis present

## 2023-08-15 DIAGNOSIS — F17200 Nicotine dependence, unspecified, uncomplicated: Secondary | ICD-10-CM | POA: Insufficient documentation

## 2023-08-15 LAB — URINALYSIS, MICROSCOPIC (REFLEX): Bacteria, UA: NONE SEEN

## 2023-08-15 LAB — URINALYSIS, ROUTINE W REFLEX MICROSCOPIC
Glucose, UA: NEGATIVE mg/dL
Ketones, ur: NEGATIVE mg/dL
Leukocytes,Ua: NEGATIVE
Nitrite: NEGATIVE
Protein, ur: NEGATIVE mg/dL
Specific Gravity, Urine: 1.02 (ref 1.005–1.030)
pH: 6.5 (ref 5.0–8.0)

## 2023-08-15 MED ORDER — KETOROLAC TROMETHAMINE 30 MG/ML IJ SOLN
15.0000 mg | Freq: Once | INTRAMUSCULAR | Status: AC
  Administered 2023-08-15: 15 mg via INTRAMUSCULAR
  Filled 2023-08-15: qty 1

## 2023-08-15 MED ORDER — ONDANSETRON HCL 4 MG PO TABS
4.0000 mg | ORAL_TABLET | Freq: Three times a day (TID) | ORAL | 0 refills | Status: AC | PRN
  Filled 2023-08-15: qty 12, 4d supply, fill #0

## 2023-08-15 MED ORDER — KETOROLAC TROMETHAMINE 30 MG/ML IJ SOLN: 30.0000 mg | Freq: Once | INTRAMUSCULAR | Status: DC

## 2023-08-15 MED ORDER — TAMSULOSIN HCL 0.4 MG PO CAPS
0.4000 mg | ORAL_CAPSULE | Freq: Every day | ORAL | 0 refills | Status: AC
  Filled 2023-08-15: qty 14, 14d supply, fill #0

## 2023-08-15 MED ORDER — HYDROCODONE-ACETAMINOPHEN 5-325 MG PO TABS
1.0000 | ORAL_TABLET | Freq: Four times a day (QID) | ORAL | 0 refills | Status: AC | PRN
Start: 2023-08-15 — End: ?
  Filled 2023-08-15: qty 10, 3d supply, fill #0

## 2023-08-15 MED ORDER — SENNOSIDES-DOCUSATE SODIUM 8.6-50 MG PO TABS
1.0000 | ORAL_TABLET | Freq: Every evening | ORAL | 0 refills | Status: AC | PRN
  Filled 2023-08-15: qty 100, 100d supply, fill #0

## 2023-08-15 NOTE — ED Triage Notes (Signed)
C/O of dysuria and "purple / red" discoloration on penis.

## 2023-08-15 NOTE — ED Provider Notes (Signed)
Emergency Department Provider Note   I have reviewed the triage vital signs and the nursing notes.   HISTORY  Chief Complaint Dysuria   HPI Travis Goodman is a 42 y.o. male past history reviewed below presents to the emergency department with hematuria and sensation in his penis that he is having another kidney stone.  He was seen in late August for similar symptoms and completed a course of pain medication and Flomax.  He did not require urology intervention.  A small kidney stone was appreciated on CT imaging at that time.  He tells me that today he was trying to urinate and the tip of his penis was painful and red/purple in color, "like I was trying to pee something out but it was stuck." Denies any rash. Denies discharge. Minimal back/flank pain.    Past Medical History:  Diagnosis Date   Anxiety    Bipolar 1 disorder (HCC)    Crohn's colitis (HCC)    Depression    Hypertension    Paranoia (HCC)    Schizophrenia, paranoid type (HCC)    Ulcerative colitis (HCC)     Review of Systems  Constitutional: No fever/chills Eyes: No visual changes. ENT: No sore throat. Cardiovascular: Denies chest pain. Respiratory: Denies shortness of breath. Gastrointestinal: Positive flank/abdominal pain.  No nausea, no vomiting.  No diarrhea.  No constipation. Genitourinary: Negative for dysuria. Positive hematuria.  Musculoskeletal: Negative for back pain. Skin: Negative for rash. Neurological: Negative for headaches.  ____________________________________________   PHYSICAL EXAM:  VITAL SIGNS: ED Triage Vitals  Encounter Vitals Group     BP 08/15/23 1051 (!) 168/107     Pulse Rate 08/15/23 1051 96     Resp 08/15/23 1051 17     Temp 08/15/23 1051 98.4 F (36.9 C)     Temp Source 08/15/23 1051 Oral     SpO2 08/15/23 1051 99 %     Weight 08/15/23 1051 173 lb 12.8 oz (78.8 kg)     Height 08/15/23 1051 5\' 9"  (1.753 m)   Constitutional: Alert and oriented. Appears uncomfortable.   Eyes: Conjunctivae are normal.  Head: Atraumatic. Nose: No congestion/rhinnorhea. Mouth/Throat: Mucous membranes are moist.   Neck: No stridor.   Cardiovascular: Normal rate, regular rhythm. Good peripheral circulation. Grossly normal heart sounds.   Respiratory: Normal respiratory effort.  No retractions. Lungs CTAB. Gastrointestinal: Soft and nontender. No distention.  GU: Advised examination of the groin area but patient refused. He understands that this significantly limits my evaluation for possible abnormal color of the penis. Explained that a chaperone would be present but he refused.  Musculoskeletal: No gross deformities of extremities. Neurologic:  Normal speech and language.  Skin:  Skin is warm, dry and intact. No rash noted.   ____________________________________________   LABS (all labs ordered are listed, but only abnormal results are displayed)  Labs Reviewed  URINALYSIS, ROUTINE W REFLEX MICROSCOPIC - Abnormal; Notable for the following components:      Result Value   Hgb urine dipstick TRACE (*)    Bilirubin Urine SMALL (*)    All other components within normal limits  URINALYSIS, MICROSCOPIC (REFLEX)  GC/CHLAMYDIA PROBE AMP ([place]) NOT AT [facility]   ____________________________________________   PROCEDURES  Procedure(s) performed:   Procedures  None  ____________________________________________   INITIAL IMPRESSION / ASSESSMENT AND PLAN / ED COURSE  Pertinent labs & imaging results that were available during my care of the patient were reviewed by me and considered in my medical decision making (  see chart for details).   This patient is Presenting for Evaluation of kidney stone, which does require a range of treatment options, and is a complaint that involves a high risk of morbidity and mortality.  The Differential Diagnoses includes but is not exclusive to acute appendicitis, renal colic, testicular torsion, urinary tract infection, prostatitis,   diverticulitis, small bowel obstruction, colitis, abdominal aortic aneurysm, gastroenteritis, constipation etc.   Critical Interventions-    Medications  ketorolac (TORADOL) 30 MG/ML injection 15 mg (15 mg Intramuscular Given 08/15/23 1130)    Reassessment after intervention: pain improved.    I decided to review pertinent External Data, and in summary patient with ED visit on 8/25 with ureteral stone at that time. Creatinine 1.46 at that time (baseline).    Clinical Laboratory Tests Ordered, included UA with Hb but no evidence of infection.   Cardiac Monitor Tracing which shows NSR.   Social Determinants of Health Risk patient is a smoker.    Medical Decision Making: Summary:  Presents to the emergency department with likely ureteral stone.  He had imaging in the past month showing stone and symptoms feel similar.  Do not plan for repeat imaging.  He describes some discoloration to the penis but will not let me examine the area.  He understands that this significantly limits my ability to help him with this issue but is unwilling to allow an exam.   Reevaluation with update and discussion with patient. Pain is well managed here. Plan for Urology follow up and expectant mgmt of symptoms at home. Discussed strict ED return precautions.   Patient's presentation is most consistent with acute presentation with potential threat to life or bodily function.   Disposition: discharge  ____________________________________________  FINAL CLINICAL IMPRESSION(S) / ED DIAGNOSES  Final diagnoses:  Dysuria  Hematuria, unspecified type     NEW OUTPATIENT MEDICATIONS STARTED DURING THIS VISIT:  Discharge Medication List as of 08/15/2023 11:55 AM     START taking these medications   Details  senna-docusate (SENOKOT-S) 8.6-50 MG tablet Take 1 tablet by mouth at bedtime as needed for mild constipation., Starting Wed 08/15/2023, Normal        Note:  This document was prepared using  Dragon voice recognition software and may include unintentional dictation errors.  Alona Bene, MD, [facility] Emergency Medicine    Ermagene Saidi, Arlyss Repress, MD 08/15/23 [phone]

## 2023-08-15 NOTE — Discharge Instructions (Signed)
You have been seen in the Emergency Department (ED) today for pain that we believe based on your workup, is caused by kidney stones.  As we have discussed, please drink plenty of fluids.  Please make a follow up appointment with the physician(s) listed elsewhere in this documentation. ° °You may take pain medication as needed but ONLY as prescribed.  Please also take your prescribed Flomax daily.  We also recommend that you take over-the-counter ibuprofen regularly according to label instructions over the next 5 days.  Take it with meals to minimize stomach discomfort. ° °Please see your doctor as soon as possible as stones may take 1-3 weeks to pass and you may require additional care or medications. ° °Do not drink alcohol, drive or participate in any other potentially dangerous activities while taking opiate pain medication as it may make you sleepy. Do not take this medication with any other sedating medications, either prescription or over-the-counter. If you were prescribed Percocet or Vicodin, do not take these with acetaminophen (Tylenol) as it is already contained within these medications. °  °Take Vicodin as needed for severe pain.  This medication is an opiate (or narcotic) pain medication and can be habit forming.  Use it as little as possible to achieve adequate pain control.  Do not use or use it with extreme caution if you have a history of opiate abuse or dependence.  If you are on a pain contract with your primary care doctor or a pain specialist, be sure to let them know you were prescribed this medication today from the Emergency Department.  This medication is intended for your use only - do not give any to anyone else and keep it in a secure place where nobody else, especially children, have access to it.  It will also cause or worsen constipation, so you may want to consider taking an over-the-counter stool softener while you are taking this medication. ° °Return to the Emergency Department  (ED) or call your doctor if you have any worsening pain, fever, painful urination, are unable to urinate, or develop other symptoms that concern you. ° °

## 2023-08-16 LAB — GC/CHLAMYDIA PROBE AMP (~~LOC~~) NOT AT ARMC
Chlamydia: NEGATIVE
Comment: NEGATIVE
Comment: NORMAL
Neisseria Gonorrhea: NEGATIVE

## 2023-09-10 ENCOUNTER — Ambulatory Visit: Payer: MEDICAID | Admitting: Internal Medicine

## 2023-09-10 ENCOUNTER — Telehealth: Payer: Self-pay | Admitting: General Practice

## 2023-09-10 NOTE — Progress Notes (Deleted)
[facility] [address] [place] [address] Dept: [phone] Dept Fax: [phone]  New Patient Office Visit  Subjective:   Travis Goodman Mar 09, 1981 09/10/2023  No chief complaint on file.   HPI: Travis Goodman presents today to establish care at [facility] [employer]. Introduced to Publishing rights manager role and practice setting.  All questions answered.  Concerns: See below   Discussed the use of AI scribe software for clinical note transcription with the patient, who gave verbal consent to proceed.  History of Present Illness           The following portions of the patient's history were reviewed and updated as appropriate: past medical history, past surgical history, family history, social history, allergies, medications, and problem list.   There are no problems to display for this patient.  Past Medical History:  Diagnosis Date   Anxiety    Bipolar 1 disorder (HCC)    Crohn's colitis (HCC)    Depression    Hypertension    Paranoia (HCC)    Schizophrenia, paranoid type (HCC)    Ulcerative colitis (HCC)    Past Surgical History:  Procedure Laterality Date   APPENDECTOMY     arm fracture surgery     Family History  Problem Relation Age of Onset   Heart failure Mother    Diabetes Mother    Hypertension Mother     Current Outpatient Medications:    amLODipine (NORVASC) 5 MG tablet, Take 1 tablet (5 mg total) by mouth daily., Disp: 30 tablet, Rfl: 2   HYDROcodone-acetaminophen (NORCO/VICODIN) 5-325 MG tablet, Take 1 tablet by mouth every 6 (six) hours as needed for severe pain., Disp: 10 tablet, Rfl: 0   lisinopril-hydrochlorothiazide (ZESTORETIC) 10-12.5 MG tablet, Take 1 tablet by mouth daily., Disp: 30 tablet, Rfl: 11   naproxen (NAPROSYN) 500 MG tablet, Take 1 tablet (500 mg total) by mouth 2 (two) times daily., Disp: 30 tablet, Rfl: 0   ondansetron (ZOFRAN) 4 MG tablet, Take 1  tablet (4 mg total) by mouth every 8 (eight) hours as needed for nausea or vomiting., Disp: 12 tablet, Rfl: 0   senna-docusate (SENOKOT-S) 8.6-50 MG tablet, Take 1 tablet by mouth at bedtime as needed for mild constipation., Disp: 100 tablet, Rfl: 0 No Known Allergies  ROS: A complete ROS was performed with pertinent positives/negatives noted in the HPI. The remainder of the ROS are negative.   Objective:   There were no vitals filed for this visit.  GENERAL: Well-appearing, in NAD. Well nourished.  SKIN: Pink, warm and dry. No rash, lesion, ulceration, or ecchymoses.  NECK: Trachea midline. Full ROM w/o pain or tenderness. No lymphadenopathy.  RESPIRATORY: Chest wall symmetrical. Respirations even and non-labored. Breath sounds clear to auscultation bilaterally.  CARDIAC: S1, S2 present, regular rate and rhythm. Peripheral pulses 2+ bilaterally.  MSK: Muscle tone and strength appropriate for age. Joints w/o tenderness, redness, or swelling.  EXTREMITIES: Without clubbing, cyanosis, or edema.  NEUROLOGIC: No motor or sensory deficits. Steady, even gait.  PSYCH/MENTAL STATUS: Alert, oriented x 3. Cooperative, appropriate mood and affect.   Health Maintenance Due  Topic Date Due   DTaP/Tdap/Td (1 - Tdap) Never done   INFLUENZA VACCINE  Never done   COVID-19 Vaccine (1 - 2023-24 season) Never done    No results found for any visits on 09/10/23.  Assessment & Plan:  Assessment and Plan            There are  no diagnoses linked to this encounter.  No orders of the defined types were placed in this encounter.  No orders of the defined types were placed in this encounter.   No follow-ups on file.   Salvatore Decent, FNP

## 2023-09-10 NOTE — Telephone Encounter (Signed)
Pt was a no show for a NP appt on 09/10/23 with Morrie Sheldon, no letter, I dismissed.

## 2023-09-13 ENCOUNTER — Ambulatory Visit: Payer: MEDICAID | Admitting: Family Medicine

## 2023-10-12 ENCOUNTER — Encounter (HOSPITAL_BASED_OUTPATIENT_CLINIC_OR_DEPARTMENT_OTHER): Payer: Self-pay | Admitting: Emergency Medicine

## 2023-10-12 ENCOUNTER — Other Ambulatory Visit: Payer: Self-pay

## 2023-10-12 ENCOUNTER — Emergency Department (HOSPITAL_BASED_OUTPATIENT_CLINIC_OR_DEPARTMENT_OTHER)
Admission: EM | Admit: 2023-10-12 | Discharge: 2023-10-13 | Disposition: A | Discharge status: Home / Self Care | Payer: MEDICAID | Attending: Emergency Medicine | Admitting: Emergency Medicine

## 2023-10-12 DIAGNOSIS — R109 Unspecified abdominal pain: Secondary | ICD-10-CM | POA: Diagnosis present

## 2023-10-12 DIAGNOSIS — Z87442 Personal history of urinary calculi: Secondary | ICD-10-CM | POA: Diagnosis not present

## 2023-10-12 MED ORDER — KETOROLAC TROMETHAMINE 30 MG/ML IJ SOLN
30.0000 mg | Freq: Once | INTRAMUSCULAR | Status: AC
  Administered 2023-10-13: 30 mg via INTRAVENOUS
  Filled 2023-10-12: qty 1

## 2023-10-12 MED ORDER — ONDANSETRON HCL 4 MG/2ML IJ SOLN
4.0000 mg | Freq: Once | INTRAMUSCULAR | Status: AC
  Administered 2023-10-13: 4 mg via INTRAVENOUS
  Filled 2023-10-12: qty 2

## 2023-10-12 NOTE — ED Triage Notes (Signed)
Pt presents to the ED via POV with complaints of R sided flank pain that started several days ago. Endorses pain with urination. Hx of kidney stones and crohn's disease. Pt rates the pain 9-10, no meds taken PTA. A&Ox4 at this time. Denies CP or SOB.

## 2023-10-12 NOTE — ED Provider Notes (Signed)
[facility] Provider Note   CSN: [id number] Arrival date & time: 10/12/23  2335     History  Chief Complaint  Patient presents with   Flank Pain    Travis Goodman is a 42 y.o. male.  Patient is a 42 year old male with history of kidney stones.  Patient presenting today with complaints of right flank and abdominal pain starting earlier this week.  He feels as though he has another kidney stone.  He describes difficulty voiding, but no fevers or chills.  He denies any bowel complaints.  No aggravating or alleviating factors.  The history is provided by the patient.       Home Medications Prior to Admission medications   Medication Sig Start Date End Date Taking? Authorizing Provider  amLODipine (NORVASC) 5 MG tablet Take 1 tablet (5 mg total) by mouth daily. 05/15/23   Terrilee Files, MD  HYDROcodone-acetaminophen (NORCO/VICODIN) 5-325 MG tablet Take 1 tablet by mouth every 6 (six) hours as needed for severe pain. 08/15/23   Long, Arlyss Repress, MD  lisinopril-hydrochlorothiazide (ZESTORETIC) 10-12.5 MG tablet Take 1 tablet by mouth daily. 07/30/20 07/30/21  Joni Reining, PA-C  naproxen (NAPROSYN) 500 MG tablet Take 1 tablet (500 mg total) by mouth 2 (two) times daily. 03/24/23   Clark, Meghan R, PA-C  ondansetron (ZOFRAN) 4 MG tablet Take 1 tablet (4 mg total) by mouth every 8 (eight) hours as needed for nausea or vomiting. 08/15/23   Long, Arlyss Repress, MD  senna-docusate (SENOKOT-S) 8.6-50 MG tablet Take 1 tablet by mouth at bedtime as needed for mild constipation. 08/15/23   Long, Arlyss Repress, MD      Allergies    Patient has no known allergies.    Review of Systems   Review of Systems  All other systems reviewed and are negative.   Physical Exam Updated Vital Signs BP (!) 158/102   Pulse 100   Temp 98 F (36.7 C)   Resp 20   Ht 5\' 9"  (1.753 m)   Wt 81.6 kg   SpO2 100%   BMI 26.58 kg/m  Physical Exam Vitals and nursing note  reviewed.  Constitutional:      General: He is not in acute distress.    Appearance: He is well-developed. He is not diaphoretic.  HENT:     Head: Normocephalic and atraumatic.  Cardiovascular:     Rate and Rhythm: Normal rate and regular rhythm.     Heart sounds: No murmur heard.    No friction rub.  Pulmonary:     Effort: Pulmonary effort is normal. No respiratory distress.     Breath sounds: Normal breath sounds. No wheezing or rales.  Abdominal:     General: Bowel sounds are normal. There is no distension.     Palpations: Abdomen is soft.     Tenderness: There is no abdominal tenderness. There is right CVA tenderness. There is no left CVA tenderness.  Musculoskeletal:        General: Normal range of motion.     Cervical back: Normal range of motion and neck supple.  Skin:    General: Skin is warm and dry.  Neurological:     Mental Status: He is alert and oriented to person, place, and time.     Coordination: Coordination normal.     ED Results / Procedures / Treatments   Labs (all labs ordered are listed, but only abnormal results are displayed) Labs Reviewed  URINALYSIS,  ROUTINE W REFLEX MICROSCOPIC  COMPREHENSIVE METABOLIC PANEL  CBC WITH DIFFERENTIAL/PLATELET    EKG None  Radiology No results found.  Procedures Procedures  {Document cardiac monitor, telemetry assessment procedure when appropriate:1}  Medications Ordered in ED Medications  ondansetron (ZOFRAN) injection 4 mg (has no administration in time range)  ketorolac (TORADOL) 30 MG/ML injection 30 mg (has no administration in time range)    ED Course/ Medical Decision Making/ A&P   {   Click here for ABCD2, HEART and other calculatorsREFRESH Note before signing :1}                              Medical Decision Making Amount and/or Complexity of Data Reviewed Labs: ordered. Radiology: ordered.  Risk Prescription drug management.   ***  {Document critical care time when  appropriate:1} {Document review of labs and clinical decision tools ie heart score, Chads2Vasc2 etc:1}  {Document your independent review of radiology images, and any outside records:1} {Document your discussion with family members, caretakers, and with consultants:1} {Document social determinants of health affecting pt's care:1} {Document your decision making why or why not admission, treatments were needed:1} Final Clinical Impression(s) / ED Diagnoses Final diagnoses:  None    Rx / DC Orders ED Discharge Orders     None

## 2023-10-13 ENCOUNTER — Emergency Department (HOSPITAL_BASED_OUTPATIENT_CLINIC_OR_DEPARTMENT_OTHER): Payer: MEDICAID

## 2023-10-13 LAB — CBC WITH DIFFERENTIAL/PLATELET
Abs Immature Granulocytes: 0.02 10*3/uL (ref 0.00–0.07)
Basophils Absolute: 0 10*3/uL (ref 0.0–0.1)
Basophils Relative: 0 %
Eosinophils Absolute: 0.1 10*3/uL (ref 0.0–0.5)
Eosinophils Relative: 1 %
HCT: 47.4 % (ref 39.0–52.0)
Hemoglobin: 16 g/dL (ref 13.0–17.0)
Immature Granulocytes: 0 %
Lymphocytes Relative: 17 %
Lymphs Abs: 1.5 10*3/uL (ref 0.7–4.0)
MCH: 31.4 pg (ref 26.0–34.0)
MCHC: 33.8 g/dL (ref 30.0–36.0)
MCV: 93.1 fL (ref 80.0–100.0)
Monocytes Absolute: 0.9 10*3/uL (ref 0.1–1.0)
Monocytes Relative: 10 %
Neutro Abs: 6.5 10*3/uL (ref 1.7–7.7)
Neutrophils Relative %: 72 %
Platelets: 249 10*3/uL (ref 150–400)
RBC: 5.09 MIL/uL (ref 4.22–5.81)
RDW: 10.9 % — ABNORMAL LOW (ref 11.5–15.5)
WBC: 9.1 10*3/uL (ref 4.0–10.5)
nRBC: 0 % (ref 0.0–0.2)

## 2023-10-13 LAB — COMPREHENSIVE METABOLIC PANEL
ALT: 20 U/L (ref 0–44)
AST: 28 U/L (ref 15–41)
Albumin: 4.2 g/dL (ref 3.5–5.0)
Alkaline Phosphatase: 122 U/L (ref 38–126)
Anion gap: 9 (ref 5–15)
BUN: 18 mg/dL (ref 6–20)
CO2: 25 mmol/L (ref 22–32)
Calcium: 9.2 mg/dL (ref 8.9–10.3)
Chloride: 102 mmol/L (ref 98–111)
Creatinine, Ser: 1.11 mg/dL (ref 0.61–1.24)
GFR, Estimated: >60 mL/min (ref >60)
Glucose, Bld: 82 mg/dL (ref 70–99)
Potassium: 3.2 mmol/L — ABNORMAL LOW (ref 3.5–5.1)
Sodium: 136 mmol/L (ref 135–145)
Total Bilirubin: 1.4 mg/dL — ABNORMAL HIGH (ref <1.2)
Total Protein: 7.5 g/dL (ref 6.5–8.1)

## 2023-10-13 LAB — URINALYSIS, ROUTINE W REFLEX MICROSCOPIC
Glucose, UA: NEGATIVE mg/dL
Ketones, ur: 15 mg/dL — AB
Leukocytes,Ua: NEGATIVE
Nitrite: NEGATIVE
Protein, ur: 30 mg/dL — AB
Specific Gravity, Urine: >=1.03 (ref 1.005–1.030)
pH: 5.5 (ref 5.0–8.0)

## 2023-10-13 LAB — URINALYSIS, MICROSCOPIC (REFLEX)

## 2023-10-13 NOTE — ED Notes (Signed)
Patient transported to CT 

## 2023-10-13 NOTE — Discharge Instructions (Signed)
Take ibuprofen 600 mg every 6 hours as needed for pain.  Follow-up with primary doctor if not improving in the next 4 to 5 days, and return to the ER if symptoms significantly worsen or change.
